# Patient Record
Sex: Female | Born: 1985 | Race: White | Hispanic: No | Marital: Married | Smoking: Former smoker
Health system: Southern US, Community
[De-identification: ages and names within clinical notes are randomized; demographics above are authoritative.]

---

## 2015-05-07 DIAGNOSIS — O24419 Gestational diabetes mellitus in pregnancy, unspecified control: Secondary | ICD-10-CM

## 2018-11-19 LAB — HIV ANTIBODY (ROUTINE TESTING W REFLEX): HIV Screen 4th Generation wRfx: NONREACTIVE

## 2018-11-19 LAB — OB RESULTS CONSOLE RPR: RPR: NONREACTIVE

## 2018-11-19 LAB — OB RESULTS CONSOLE ABO/RH: RH TYPE: POSITIVE

## 2018-11-19 LAB — OB RESULTS CONSOLE HIV ANTIBODY (ROUTINE TESTING): HIV: NONREACTIVE

## 2018-11-19 LAB — OB RESULTS CONSOLE VARICELLA ZOSTER ANTIBODY, IGG: Varicella: IMMUNE

## 2018-11-19 LAB — OB RESULTS CONSOLE HEPATITIS B SURFACE ANTIGEN: Hepatitis B Surface Ag: NEGATIVE

## 2018-11-19 LAB — OB RESULTS CONSOLE PLATELET COUNT: Platelets: 251

## 2018-11-19 LAB — OB RESULTS CONSOLE HGB/HCT, BLOOD
HEMATOCRIT: 37 (ref 29–41)
Hemoglobin: 12.1

## 2018-11-19 LAB — OB RESULTS CONSOLE ANTIBODY SCREEN: Antibody Screen: NEGATIVE

## 2018-12-07 LAB — OB RESULTS CONSOLE HEPATITIS B SURFACE ANTIGEN: HEP B S AG: NEGATIVE

## 2018-12-07 LAB — OB RESULTS CONSOLE HIV ANTIBODY (ROUTINE TESTING): HIV: NONREACTIVE

## 2018-12-07 LAB — HIV ANTIBODY (ROUTINE TESTING W REFLEX): HIV Screen 4th Generation wRfx: NONREACTIVE

## 2018-12-07 LAB — OB RESULTS CONSOLE VARICELLA ZOSTER ANTIBODY, IGG: Varicella: IMMUNE

## 2018-12-07 LAB — OB RESULTS CONSOLE ANTIBODY SCREEN: Antibody Screen: NEGATIVE

## 2019-01-02 LAB — OB RESULTS CONSOLE GC/CHLAMYDIA
CHLAMYDIA, DNA PROBE: NEGATIVE
Gonorrhea: NEGATIVE

## 2019-01-16 DIAGNOSIS — Z363 Encounter for antenatal screening for malformations: Secondary | ICD-10-CM | POA: Diagnosis not present

## 2019-02-20 DIAGNOSIS — Z3480 Encounter for supervision of other normal pregnancy, unspecified trimester: Secondary | ICD-10-CM | POA: Insufficient documentation

## 2019-02-21 ENCOUNTER — Other Ambulatory Visit: Payer: Self-pay

## 2019-02-21 ENCOUNTER — Encounter: Payer: Self-pay | Admitting: Obstetrics and Gynecology

## 2019-02-21 ENCOUNTER — Ambulatory Visit (INDEPENDENT_AMBULATORY_CARE_PROVIDER_SITE_OTHER): Payer: BLUE CROSS/BLUE SHIELD | Admitting: Obstetrics and Gynecology

## 2019-02-21 DIAGNOSIS — Z8632 Personal history of gestational diabetes: Secondary | ICD-10-CM

## 2019-02-21 DIAGNOSIS — Z3480 Encounter for supervision of other normal pregnancy, unspecified trimester: Secondary | ICD-10-CM

## 2019-02-21 DIAGNOSIS — O09299 Supervision of pregnancy with other poor reproductive or obstetric history, unspecified trimester: Secondary | ICD-10-CM | POA: Insufficient documentation

## 2019-02-21 DIAGNOSIS — Z3A25 25 weeks gestation of pregnancy: Secondary | ICD-10-CM

## 2019-02-21 DIAGNOSIS — O09292 Supervision of pregnancy with other poor reproductive or obstetric history, second trimester: Secondary | ICD-10-CM

## 2019-02-21 DIAGNOSIS — O34219 Maternal care for unspecified type scar from previous cesarean delivery: Secondary | ICD-10-CM

## 2019-02-21 DIAGNOSIS — Z3009 Encounter for other general counseling and advice on contraception: Secondary | ICD-10-CM

## 2019-02-21 NOTE — Progress Notes (Signed)
Pt is here for initial ROB, transfer from [facility]. [redacted]w[redacted]d.

## 2019-02-21 NOTE — Progress Notes (Signed)
  Subjective:    Janet Ortiz is a [id number] [redacted]w[redacted]d being seen today for on going prenatal visit. Patient transferred care from [place] with records.  Her obstetrical history is significant for history of GDM on insulin during first pregnancy, fetal macrosomia without GDM with second pregnancy, previous cesarean section x 2. Patient does intend to breast feed. Pregnancy history fully reviewed.  Patient reports no complaints.  Vitals:   02/21/19 1016 02/21/19 1017  BP: 108/69   Pulse: 84   Weight: 195 lb 9.6 oz (88.7 kg)   Height:  5\' 6"  (1.676 m)    HISTORY: OB History  Gravida Para Term Preterm AB Living  3 2 2     2   SAB TAB Ectopic Multiple Live Births               # Outcome Date GA Lbr Len/2nd Weight Sex Delivery Anes PTL Lv  3 Current           2 Term           1 Term            History reviewed. No pertinent past medical history. Past Surgical History:  Procedure Laterality Date  . CESAREAN SECTION     Family History  Problem Relation Age of Onset  . Diabetes Mother   . Diabetes Maternal Grandmother      Exam    Uterus:  Fundal Height: 25 cm25 cm      Assessment:    Pregnancy: [id number] Patient Active Problem List   Diagnosis Date Noted  . History of gestational diabetes in prior pregnancy, currently pregnant 02/21/2019  . Previous cesarean delivery affecting pregnancy 02/21/2019  . Supervision of other normal pregnancy, antepartum 02/20/2019        Plan:     Initial labs drawn. Prenatal vitamins. Problem list reviewed and updated. Genetic Screening discussed First Screen: results reviewed.  Ultrasound discussed; fetal survey: report requested.  Follow up in 3 weeks fasting for 2 hour glucola. Patient no longer desires fertility and plans BTL with repeat cesarean section 50% of 30 min visit spent on counseling and coordination of care.     Janet Ortiz 02/21/2019

## 2019-03-13 ENCOUNTER — Ambulatory Visit (INDEPENDENT_AMBULATORY_CARE_PROVIDER_SITE_OTHER): Payer: BLUE CROSS/BLUE SHIELD | Admitting: Obstetrics & Gynecology

## 2019-03-13 ENCOUNTER — Other Ambulatory Visit: Payer: BLUE CROSS/BLUE SHIELD

## 2019-03-13 ENCOUNTER — Other Ambulatory Visit: Payer: Self-pay

## 2019-03-13 DIAGNOSIS — Z3480 Encounter for supervision of other normal pregnancy, unspecified trimester: Secondary | ICD-10-CM

## 2019-03-13 DIAGNOSIS — Z3A27 27 weeks gestation of pregnancy: Secondary | ICD-10-CM

## 2019-03-13 DIAGNOSIS — Z3482 Encounter for supervision of other normal pregnancy, second trimester: Secondary | ICD-10-CM

## 2019-03-13 DIAGNOSIS — Z23 Encounter for immunization: Secondary | ICD-10-CM | POA: Diagnosis not present

## 2019-03-13 NOTE — Progress Notes (Signed)
   PRENATAL VISIT NOTE  Subjective:  Janet Ortiz is a 33 y.o. G3P2002 at [redacted]w[redacted]d being seen today for ongoing prenatal care.  She is currently monitored for the following issues for this high-risk pregnancy and has Supervision of other normal pregnancy, antepartum; History of gestational diabetes in prior pregnancy, currently pregnant; Previous cesarean delivery affecting pregnancy; and Unwanted fertility on their problem list.  Patient reports no complaints.  Contractions: Not present. Vag. Bleeding: None.  Movement: Present. Denies leaking of fluid.   The following portions of the patient's history were reviewed and updated as appropriate: allergies, current medications, past family history, past medical history, past social history, past surgical history and problem list.   Objective:   Vitals:   03/13/19 0821  BP: 99/64  Pulse: 80  Weight: 196 lb (88.9 kg)    Fetal Status: Fetal Heart Rate (bpm): 154   Movement: Present     General:  Alert, oriented and cooperative. Patient is in no acute distress.  Skin: Skin is warm and dry. No rash noted.   Cardiovascular: Normal heart rate noted  Respiratory: Normal respiratory effort, no problems with respiration noted  Abdomen: Soft, gravid, appropriate for gestational age.  Pain/Pressure: Absent     Pelvic: Cervical exam deferred        Extremities: Normal range of motion.  Edema: None  Mental Status: Normal mood and affect. Normal behavior. Normal judgment and thought content.   Assessment and Plan:  Pregnancy: G3P2002 at [redacted]w[redacted]d 1. Supervision of other normal pregnancy, antepartum Routine  - Glucose Tolerance, 2 Hours w/1 Hour - CBC - HIV antibody (with reflex) - RPR  Preterm labor symptoms and general obstetric precautions including but not limited to vaginal bleeding, contractions, leaking of fluid and fetal movement were reviewed in detail with the patient. Please refer to After Visit Summary for other counseling recommendations.   Return in about 2 weeks (around 03/27/2019) for tele.  No future appointments.  Scheryl Darter, MD

## 2019-03-13 NOTE — Progress Notes (Signed)
ROB/GTT.   TDAP given in LD, tolerated well. ?

## 2019-03-13 NOTE — Patient Instructions (Signed)

## 2019-03-14 LAB — HIV ANTIBODY (ROUTINE TESTING W REFLEX): HIV Screen 4th Generation wRfx: NONREACTIVE

## 2019-03-14 LAB — CBC
Hematocrit: 34.9 % (ref 34.0–46.6)
Hemoglobin: 11.5 g/dL (ref 11.1–15.9)
MCH: 30.7 pg (ref 26.6–33.0)
MCHC: 33 g/dL (ref 31.5–35.7)
MCV: 93 fL (ref 79–97)
Platelets: 167 10*3/uL (ref 150–450)
RBC: 3.74 x10E6/uL — ABNORMAL LOW (ref 3.77–5.28)
RDW: 12.6 % (ref 11.7–15.4)
WBC: 6.9 10*3/uL (ref 3.4–10.8)

## 2019-03-14 LAB — GLUCOSE TOLERANCE, 2 HOURS W/ 1HR
Glucose, 1 hour: 129 mg/dL (ref 65–179)
Glucose, 2 hour: 122 mg/dL (ref 65–152)
Glucose, Fasting: 83 mg/dL (ref 65–91)

## 2019-03-14 LAB — RPR: RPR Ser Ql: NONREACTIVE

## 2019-03-27 ENCOUNTER — Other Ambulatory Visit: Payer: Self-pay

## 2019-03-27 ENCOUNTER — Ambulatory Visit (INDEPENDENT_AMBULATORY_CARE_PROVIDER_SITE_OTHER): Payer: BLUE CROSS/BLUE SHIELD | Admitting: Obstetrics and Gynecology

## 2019-03-27 ENCOUNTER — Encounter: Payer: Self-pay | Admitting: Obstetrics and Gynecology

## 2019-03-27 DIAGNOSIS — O09299 Supervision of pregnancy with other poor reproductive or obstetric history, unspecified trimester: Secondary | ICD-10-CM

## 2019-03-27 DIAGNOSIS — Z3009 Encounter for other general counseling and advice on contraception: Secondary | ICD-10-CM

## 2019-03-27 DIAGNOSIS — O34219 Maternal care for unspecified type scar from previous cesarean delivery: Secondary | ICD-10-CM

## 2019-03-27 DIAGNOSIS — Z3480 Encounter for supervision of other normal pregnancy, unspecified trimester: Secondary | ICD-10-CM

## 2019-03-27 DIAGNOSIS — Z8632 Personal history of gestational diabetes: Secondary | ICD-10-CM

## 2019-03-27 DIAGNOSIS — Z3A29 29 weeks gestation of pregnancy: Secondary | ICD-10-CM

## 2019-03-27 DIAGNOSIS — O09293 Supervision of pregnancy with other poor reproductive or obstetric history, third trimester: Secondary | ICD-10-CM

## 2019-03-27 NOTE — Progress Notes (Signed)
   TELEHEALTH VIRTUAL OBSTETRICS VISIT ENCOUNTER NOTE  I connected with Janet Ortiz on 03/27/19 at  9:00 AM EDT by telephone at home and verified that I am speaking with the correct person using two identifiers.   I discussed the limitations, risks, security and privacy concerns of performing an evaluation and management service by telephone and the availability of in person appointments. I also discussed with the patient that there may be a patient responsible charge related to this service. The patient expressed understanding and agreed to proceed.  Subjective:  Janet Ortiz is a 33 y.o. G3P2002 at [redacted]w[redacted]d being followed for ongoing prenatal care.  She is currently monitored for the following issues for this high-risk pregnancy and has Supervision of other normal pregnancy, antepartum; History of gestational diabetes in prior pregnancy, currently pregnant; Previous cesarean delivery affecting pregnancy; and Unwanted fertility on their problem list.  Patient reports no complaints. Reports fetal movement. Denies any contractions, bleeding or leaking of fluid.   The following portions of the patient's history were reviewed and updated as appropriate: allergies, current medications, past family history, past medical history, past social history, past surgical history and problem list.   Objective:   General:  Alert, oriented and cooperative.   Mental Status: Normal mood and affect perceived. Normal judgment and thought content.  Rest of physical exam deferred due to type of encounter  Assessment and Plan:  Pregnancy: G3P2002 at [redacted]w[redacted]d 1. Supervision of other normal pregnancy, antepartum Stable - Babyscripts Schedule Optimization  2. Previous cesarean delivery affecting pregnancy For repeat at 39 weeks  3. History of gestational diabetes in prior pregnancy, currently pregnant Normal Glucola Will check growth @ 36-37 weeks  4. Unwanted fertility Has BCBS  Preterm labor symptoms and general  obstetric precautions including but not limited to vaginal bleeding, contractions, leaking of fluid and fetal movement were reviewed in detail with the patient.  I discussed the assessment and treatment plan with the patient. The patient was provided an opportunity to ask questions and all were answered. The patient agreed with the plan and demonstrated an understanding of the instructions. The patient was advised to call back or seek an in-person office evaluation/go to MAU at [facility] for any urgent or concerning symptoms. Please refer to After Visit Summary for other counseling recommendations.   I provided 11 minutes of non-face-to-face time during this encounter.  Return in about 2 weeks (around 04/10/2019) for OB visit, televisit.  No future appointments.  Hermina Staggers, MD Center for [facility], [facility]

## 2019-03-27 NOTE — Progress Notes (Signed)
Pt is on the phone preparing for webex visit with provider. [redacted]w[redacted]d.

## 2019-04-10 ENCOUNTER — Ambulatory Visit (INDEPENDENT_AMBULATORY_CARE_PROVIDER_SITE_OTHER): Payer: BLUE CROSS/BLUE SHIELD | Admitting: Obstetrics

## 2019-04-10 ENCOUNTER — Other Ambulatory Visit: Payer: Self-pay

## 2019-04-10 ENCOUNTER — Encounter: Payer: Self-pay | Admitting: Obstetrics

## 2019-04-10 DIAGNOSIS — Z3A31 31 weeks gestation of pregnancy: Secondary | ICD-10-CM

## 2019-04-10 DIAGNOSIS — O34219 Maternal care for unspecified type scar from previous cesarean delivery: Secondary | ICD-10-CM

## 2019-04-10 DIAGNOSIS — M5440 Lumbago with sciatica, unspecified side: Secondary | ICD-10-CM

## 2019-04-10 DIAGNOSIS — O26893 Other specified pregnancy related conditions, third trimester: Secondary | ICD-10-CM

## 2019-04-10 MED ORDER — COMFORT FIT MATERNITY SUPP SM MISC: 0 refills | Status: DC

## 2019-04-10 NOTE — Progress Notes (Signed)
   TELEHEALTH VIRTUAL OBSTETRICS PRENATAL VISIT ENCOUNTER NOTE  I connected with Janet Ortiz on 04/10/19 at  9:15 AM EDT by WebEx at home and verified that I am speaking with the correct person using two identifiers.   I discussed the limitations, risks, security and privacy concerns of performing an evaluation and management service by telephone and the availability of in person appointments. I also discussed with the patient that there may be a patient responsible charge related to this service. The patient expressed understanding and agreed to proceed. Subjective:  Janet Ortiz is a 33 y.o. G3P2002 at [redacted]w[redacted]d being seen today for ongoing prenatal care.  She is currently monitored for the following issues for this low-risk pregnancy and has Supervision of other normal pregnancy, antepartum; History of gestational diabetes in prior pregnancy, currently pregnant; Previous cesarean delivery affecting pregnancy; and Unwanted fertility on their problem list.  Patient reports backache.  Reports fetal movement. Contractions: Not present. Vag. Bleeding: None.  Movement: Present. Denies any contractions, bleeding or leaking of fluid.   The following portions of the patient's history were reviewed and updated as appropriate: allergies, current medications, past family history, past medical history, past social history, past surgical history and problem list.   Objective:  There were no vitals filed for this visit.  Fetal Status:     Movement: Present     General:  Alert, oriented and cooperative. Patient is in no acute distress.  Respiratory: Normal respiratory effort, no problems with respiration noted  Mental Status: Normal mood and affect. Normal behavior. Normal judgment and thought content.  Rest of physical exam deferred due to type of encounter  Assessment and Plan:  Pregnancy: G3P2002 at [redacted]w[redacted]d 1. Bilateral low back pain with sciatica, sciatica laterality unspecified, unspecified chronicity Rx:  - Elastic Bandages & Supports (COMFORT FIT MATERNITY SUPP SM) MISC; WEAR AS DIRECTED.  Dispense: 1 each; Refill: 0  2. Previous cesarean delivery affecting pregnancy   Preterm labor symptoms and general obstetric precautions including but not limited to vaginal bleeding, contractions, leaking of fluid and fetal movement were reviewed in detail with the patient. I discussed the assessment and treatment plan with the patient. The patient was provided an opportunity to ask questions and all were answered. The patient agreed with the plan and demonstrated an understanding of the instructions. The patient was advised to call back or seek an in-person office evaluation/go to MAU at [facility] for any urgent or concerning symptoms. Please refer to After Visit Summary for other counseling recommendations.   I provided 10 minutes of face-to-face via WebEx time during this encounter.  Return in about 2 weeks (around 04/24/2019) for [facility].   Coral Ceo, MD Center for [facility], [facility] 04-10-2019

## 2019-04-18 ENCOUNTER — Telehealth: Payer: Self-pay | Admitting: *Deleted

## 2019-04-18 NOTE — Telephone Encounter (Signed)
Pt states she has been working at home due to job site closer due to [employer]. Pt states her job is reopening on June 1st for employees to come back in to work. Pt would like to know if we can approve for her to continue to work at home for the 3 weeks until she is scheduled to deliver. Pt ask for letter to be typed stating she may continue to work from home and will return after her postpartum period.     Please advise...may pt continue to work from home until delivery.

## 2019-04-19 NOTE — Telephone Encounter (Signed)
Patient may continue to work at home until after delivery.

## 2019-04-22 ENCOUNTER — Encounter: Payer: Self-pay | Admitting: *Deleted

## 2019-04-24 ENCOUNTER — Other Ambulatory Visit: Payer: Self-pay

## 2019-04-24 ENCOUNTER — Ambulatory Visit (INDEPENDENT_AMBULATORY_CARE_PROVIDER_SITE_OTHER): Payer: BLUE CROSS/BLUE SHIELD

## 2019-04-24 VITALS — Wt 192.0 lb

## 2019-04-24 DIAGNOSIS — Z3483 Encounter for supervision of other normal pregnancy, third trimester: Secondary | ICD-10-CM

## 2019-04-24 DIAGNOSIS — Z3A33 33 weeks gestation of pregnancy: Secondary | ICD-10-CM

## 2019-04-24 DIAGNOSIS — O34219 Maternal care for unspecified type scar from previous cesarean delivery: Secondary | ICD-10-CM

## 2019-04-24 DIAGNOSIS — Z3480 Encounter for supervision of other normal pregnancy, unspecified trimester: Secondary | ICD-10-CM

## 2019-04-24 NOTE — Progress Notes (Signed)
Pt is on the phone preparing for virtual visit with provider. [redacted]w[redacted]d. Pt has a BP cuff at home but cannot find it this morning, pt denies any headaches or blurry vision. I advised patient that if she can find cuff and check in later today, and to let us know if it is abnormal- pt verbalizes understanding.

## 2019-04-24 NOTE — Progress Notes (Signed)
   TELEHEALTH VIRTUAL OBSTETRICS VISIT ENCOUNTER NOTE  I connected with Janet Ortiz on 04/24/19 at  9:00 AM EDT by WebEx at home and verified that I am speaking with the correct person using two identifiers.   I discussed the limitations, risks, security and privacy concerns of performing an evaluation and management service by telephone and the availability of in person appointments. I also discussed with the patient that there may be a patient responsible charge related to this service. The patient expressed understanding and agreed to proceed.  Subjective:  Janet Ortiz is a 32 y.o. G3P2002 at [redacted]w[redacted]d being followed for ongoing prenatal care.  She is currently monitored for the following issues for this low-risk pregnancy and has Supervision of other normal pregnancy, antepartum; History of gestational diabetes in prior pregnancy, currently pregnant; Previous cesarean delivery affecting pregnancy; and Unwanted fertility on their problem list.  Patient reports no complaints. Reports fetal movement. Denies any contractions, bleeding or leaking of fluid.   The following portions of the patient's history were reviewed and updated as appropriate: allergies, current medications, past family history, past medical history, past social history, past surgical history and problem list.   Objective:   General:  Alert, oriented and cooperative.   Mental Status: Normal mood and affect perceived. Normal judgment and thought content.  Rest of physical exam deferred due to type of encounter  Assessment and Plan:  Pregnancy: G3P2002 at [redacted]w[redacted]d 1. Supervision of other normal pregnancy, antepartum -BP 115/57 -Per note from Dr. Alysia Penna, will schedule for growth u/s at 36 weeks due to hx of macrosomia - Encouraged patient to call ahead if experiencing respiratory symptoms with fever so that she can be triaged and directed appropriately if testing is warranted -Counseled patient on routine COVID-19 testing for all  admission to inpatient units whether direct admit or from MAU/ED. Reviewed reasons for testing including identifying cases and protecting patients/staff from possible infection. Reassured patient that separation from newborn is not required for COVID+ tests in asymptomatic patients and that the plan of care will be created with Peds through shared decision-making. One support person is allowed for all admitted patients regardless of COVID test results. All patient questions answered.    2. Previous cesarean delivery affecting pregnancy -C/S scheduled for 6/26  Preterm labor symptoms and general obstetric precautions including but not limited to vaginal bleeding, contractions, leaking of fluid and fetal movement were reviewed in detail with the patient.  I discussed the assessment and treatment plan with the patient. The patient was provided an opportunity to ask questions and all were answered. The patient agreed with the plan and demonstrated an understanding of the instructions. The patient was advised to call back or seek an in-person office evaluation/go to MAU at [facility] for any urgent or concerning symptoms. Please refer to After Visit Summary for other counseling recommendations.   I provided 15 minutes of non-face-to-face time during this encounter.  Return in about 3 weeks (around 05/15/2019) for Return OB visit/GBS.  Future Appointments  Date Time Provider Department Center  04/24/2019  9:00 AM Rolm Bookbinder, CNM [facility] None    Rolm Bookbinder, CNM Center for [employer], [facility]

## 2019-05-01 DIAGNOSIS — M5440 Lumbago with sciatica, unspecified side: Secondary | ICD-10-CM | POA: Diagnosis not present

## 2019-05-15 ENCOUNTER — Ambulatory Visit (HOSPITAL_COMMUNITY)
Admission: RE | Admit: 2019-05-15 | Discharge: 2019-05-15 | Disposition: A | Discharge status: Home / Self Care | Payer: BC Managed Care – PPO | Source: Ambulatory Visit | Attending: Obstetrics and Gynecology | Admitting: Obstetrics and Gynecology

## 2019-05-15 ENCOUNTER — Other Ambulatory Visit: Payer: Self-pay

## 2019-05-15 ENCOUNTER — Ambulatory Visit (INDEPENDENT_AMBULATORY_CARE_PROVIDER_SITE_OTHER): Payer: BC Managed Care – PPO | Admitting: Obstetrics and Gynecology

## 2019-05-15 ENCOUNTER — Encounter: Payer: Self-pay | Admitting: Obstetrics and Gynecology

## 2019-05-15 VITALS — BP 113/68 | HR 76 | Wt 207.0 lb

## 2019-05-15 DIAGNOSIS — O09299 Supervision of pregnancy with other poor reproductive or obstetric history, unspecified trimester: Secondary | ICD-10-CM

## 2019-05-15 DIAGNOSIS — Z363 Encounter for antenatal screening for malformations: Secondary | ICD-10-CM | POA: Diagnosis not present

## 2019-05-15 DIAGNOSIS — Z3A36 36 weeks gestation of pregnancy: Secondary | ICD-10-CM | POA: Diagnosis not present

## 2019-05-15 DIAGNOSIS — O34219 Maternal care for unspecified type scar from previous cesarean delivery: Secondary | ICD-10-CM

## 2019-05-15 DIAGNOSIS — O09293 Supervision of pregnancy with other poor reproductive or obstetric history, third trimester: Secondary | ICD-10-CM | POA: Diagnosis not present

## 2019-05-15 DIAGNOSIS — Z8632 Personal history of gestational diabetes: Secondary | ICD-10-CM

## 2019-05-15 DIAGNOSIS — Z3480 Encounter for supervision of other normal pregnancy, unspecified trimester: Secondary | ICD-10-CM

## 2019-05-15 DIAGNOSIS — Z113 Encounter for screening for infections with a predominantly sexual mode of transmission: Secondary | ICD-10-CM | POA: Diagnosis not present

## 2019-05-15 DIAGNOSIS — Z3009 Encounter for other general counseling and advice on contraception: Secondary | ICD-10-CM

## 2019-05-15 NOTE — Progress Notes (Signed)
   PRENATAL VISIT NOTE  Subjective:  Janet Ortiz is a 33 y.o. G3P2002 at [redacted]w[redacted]d being seen today for ongoing prenatal care.  She is currently monitored for the following issues for this low-risk pregnancy and has Supervision of other normal pregnancy, antepartum; History of gestational diabetes in prior pregnancy, currently pregnant; Previous cesarean delivery affecting pregnancy; and Unwanted fertility on their problem list.  Patient reports no complaints.  Contractions: Not present. Vag. Bleeding: None.  Movement: Present. Denies leaking of fluid.   The following portions of the patient's history were reviewed and updated as appropriate: allergies, current medications, past family history, past medical history, past social history, past surgical history and problem list.   Objective:   Vitals:   05/15/19 1606  BP: 113/68  Pulse: 76  Weight: 207 lb (93.9 kg)    Fetal Status: Fetal Heart Rate (bpm): 140   Movement: Present     General:  Alert, oriented and cooperative. Patient is in no acute distress.  Skin: Skin is warm and dry. No rash noted.   Cardiovascular: Normal heart rate noted  Respiratory: Normal respiratory effort, no problems with respiration noted  Abdomen: Soft, gravid, appropriate for gestational age.  Pain/Pressure: Present     Pelvic: Cervical exam deferred        Extremities: Normal range of motion.     Mental Status: Normal mood and affect. Normal behavior. Normal judgment and thought content.   Assessment and Plan:  Pregnancy: G3P2002 at 105w6d  1. Supervision of other normal pregnancy, antepartum GBS, GC/CT today  2. History of gestational diabetes in prior pregnancy, currently pregnant  3. Previous cesarean delivery affecting pregnancy For RCS  4. Unwanted fertility For BTL  Preterm labor symptoms and general obstetric precautions including but not limited to vaginal bleeding, contractions, leaking of fluid and fetal movement were reviewed in detail with  the patient. Please refer to After Visit Summary for other counseling recommendations.   Return in about 1 week (around 05/22/2019) for OB visit, virtual.  Future Appointments  Date Time Provider [place]  05/22/2019  9:00 AM Shelly Bombard, MD [place] None  05/28/2019  7:40 AM [facility] 1 [facility] None    Sloan Leiter, MD

## 2019-05-16 LAB — CERVICOVAGINAL ANCILLARY ONLY
Chlamydia: NEGATIVE
Neisseria Gonorrhea: NEGATIVE

## 2019-05-17 LAB — STREP GP B NAA: Strep Gp B NAA: NEGATIVE

## 2019-05-19 ENCOUNTER — Telehealth (HOSPITAL_COMMUNITY): Payer: Self-pay | Admitting: *Deleted

## 2019-05-19 NOTE — Telephone Encounter (Signed)
Preadmission screen  

## 2019-05-20 ENCOUNTER — Encounter (HOSPITAL_COMMUNITY): Payer: Self-pay

## 2019-05-20 NOTE — Patient Instructions (Signed)
Janet Ortiz  05/20/2019   Your procedure is scheduled on:  05/30/2019  Arrive at [facility] at Entrance C on [address] at [facility]  and [address]. You are invited to use the FREE valet parking or use the Visitor's parking deck.  Pick up the phone at the desk and dial [phone].  Call this number if you have problems the morning of surgery: [phone]  Remember:   Do not eat food:(After Midnight) Desps de medianoche.  Do not drink clear liquids: (After Midnight) Desps de medianoche.  Take these medicines the morning of surgery with A SIP OF WATER:  none   Do not wear jewelry, make-up or nail polish.  Do not wear lotions, powders, or perfumes. Do not wear deodorant.  Do not shave 48 hours prior to surgery.  Do not bring valuables to the hospital.  [facility] is not   responsible for any belongings or valuables brought to the hospital.  Contacts, dentures or bridgework may not be worn into surgery.  Leave suitcase in the car. After surgery it may be brought to your room.  For patients admitted to the hospital, checkout time is 11:00 AM the day of              discharge.      Please read over the following fact sheets that you were given:     Preparing for Surgery

## 2019-05-22 ENCOUNTER — Telehealth (INDEPENDENT_AMBULATORY_CARE_PROVIDER_SITE_OTHER): Payer: BC Managed Care – PPO | Admitting: Obstetrics

## 2019-05-22 ENCOUNTER — Encounter: Payer: Self-pay | Admitting: Obstetrics

## 2019-05-22 ENCOUNTER — Other Ambulatory Visit: Payer: Self-pay

## 2019-05-22 VITALS — BP 115/76 | HR 85 | Ht 66.0 in

## 2019-05-22 DIAGNOSIS — Z302 Encounter for sterilization: Secondary | ICD-10-CM

## 2019-05-22 DIAGNOSIS — Z8632 Personal history of gestational diabetes: Secondary | ICD-10-CM

## 2019-05-22 DIAGNOSIS — O09293 Supervision of pregnancy with other poor reproductive or obstetric history, third trimester: Secondary | ICD-10-CM

## 2019-05-22 DIAGNOSIS — Z3480 Encounter for supervision of other normal pregnancy, unspecified trimester: Secondary | ICD-10-CM

## 2019-05-22 DIAGNOSIS — O09299 Supervision of pregnancy with other poor reproductive or obstetric history, unspecified trimester: Secondary | ICD-10-CM

## 2019-05-22 DIAGNOSIS — O34219 Maternal care for unspecified type scar from previous cesarean delivery: Secondary | ICD-10-CM

## 2019-05-22 DIAGNOSIS — Z3A37 37 weeks gestation of pregnancy: Secondary | ICD-10-CM

## 2019-05-22 NOTE — Progress Notes (Signed)
TELEHEALTH OBSTETRICS PRENATAL VIRTUAL VIDEO VISIT ENCOUNTER NOTE  Provider location: [facility]   I connected with Janet CampElise Ortiz on 05/22/19 at  9:00 AM EDT by WebEx OB MyChart Video Encounter at home and verified that I am speaking with the correct person using two identifiers.   I discussed the limitations, risks, security and privacy concerns of performing an evaluation and management service by telephone and the availability of in person appointments. I also discussed with the patient that there may be a patient responsible charge related to this service. The patient expressed understanding and agreed to proceed. Subjective:  Janet Ortiz is a 33 y.o. G3P2002 at 101w6d being seen today for ongoing prenatal care.  She is currently monitored for the following issues for this low-risk pregnancy and has Supervision of other normal pregnancy, antepartum; History of gestational diabetes in prior pregnancy, currently pregnant; Previous cesarean delivery affecting pregnancy; and Unwanted fertility on their problem list.  Patient reports no complaints.   .  .   . Denies any leaking of fluid.   The following portions of the patient's history were reviewed and updated as appropriate: allergies, current medications, past family history, past medical history, past social history, past surgical history and problem list.   Objective:   Vitals:   05/22/19 0915  BP: 115/76  Pulse: 85  Height: 5\' 6"  (1.676 m)    Fetal Status:           General:  Alert, oriented and cooperative. Patient is in no acute distress.  Respiratory: Normal respiratory effort, no problems with respiration noted  Mental Status: Normal mood and affect. Normal behavior. Normal judgment and thought content.  Rest of physical exam deferred due to type of encounter  Imaging: [place] Mfm Ob Comp + 14 Wk  Result Date: 05/16/2019 ----------------------------------------------------------------------   OBSTETRICS REPORT                        (Signed Final 05/16/2019 07:24 am) ---------------------------------------------------------------------- Patient Info  ID #:       [id number]                          D.O.B.:  29-Jun-1986 (32 yrs)  Name:       Janet Ortiz                      Visit Date: 05/15/2019 02:59 pm ---------------------------------------------------------------------- Performed By  Performed By:     Tomma Lightningevin Vics             Ref. Address:      Faculty                    RDMS,RVT  Attending:        Lin Landsmanorenthian Booker      [facility]                                        [facility]  Referred By:      Rolm BookbinderAROLINE M                    NEILL CNM ----------------------------------------------------------------------  Orders   #  Description                          Code         Ordered By   1  US MFM OB COMP + 14 WK               [id number]     CAROLINE NEILL  ----------------------------------------------------------------------   #  Order #                    Accession #                 Episode #   1  [id number]                  [phone]                  [id number]  ---------------------------------------------------------------------- Indications   Encounter for antenatal screening for          Z36.3   malformations   [redacted] weeks gestation of pregnancy                Z3A.36   Poor obstetric history: Prior fetal            O09.299   macrosomia, antepartum   History of cesarean delivery, currently        O34.219   pregnant (x2)   Poor obstetric history: Previous gestational   O09.299   diabetes  ---------------------------------------------------------------------- Vital Signs  Weight (lb): 192                               Height:        5'6"  BMI:         30.99 ---------------------------------------------------------------------- Fetal Evaluation  Num Of Fetuses:          1  Fetal Heart Rate(bpm):   149  Cardiac Activity:        Observed  Presentation:             Cephalic  Placenta:                Anterior  P. Cord Insertion:       Visualized  Amniotic Fluid  AFI FV:      Within normal limits  AFI Sum(cm)     %Tile       Largest Pocket(cm)  15.38           58          5.64  RUQ(cm)       RLQ(cm)       LUQ(cm)        LLQ(cm)  5.64          4.16          1.24           4.34 ---------------------------------------------------------------------- Biometry  BPD:      89.5  mm     G. Age:  36w 2d         46  %    CI:        77.47   %    70 - 86  FL/HC:       22.0  %    20.8 - 22.6  HC:      321.9  mm     G. Age:  36w 3d         15  %    HC/AC:       0.87       0.92 - 1.05  AC:       370   mm     G. Age:  40w 6d       > 97  %    FL/BPD:      79.0  %    71 - 87  FL:       70.7  mm     G. Age:  36w 2d         33  %    FL/AC:       19.1  %    20 - 24  HUM:      62.7  mm     G. Age:  36w 3d         64  %  LV:        4.3  mm  Est. FW:    3605   gm   7 lb 15 oz    > 90  % ---------------------------------------------------------------------- OB History  Gravidity:    3         Term:   2  Living:       2 ---------------------------------------------------------------------- Gestational Age  LMP:           36w 6d        Date:  08/30/18                 EDD:   06/06/19  U/S Today:     37w 3d                                        EDD:   06/02/19  Best:          36w 6d     Det. By:  LMP  (08/30/18)          EDD:   06/06/19 ---------------------------------------------------------------------- Anatomy  Cranium:               Appears normal         LVOT:                   Not well visualized  Cavum:                 Appears normal         Aortic Arch:            Not well visualized  Ventricles:            Appears normal         Ductal Arch:            Not well visualized  Choroid Plexus:        Appears normal         Diaphragm:              Appears normal  Cerebellum:            Appears normal         Stomach:  Appears normal,  left                                                                        sided  Posterior Fossa:       Appears normal         Abdomen:                Appears normal  Nuchal Fold:           Not applicable (>20    Abdominal Wall:         Not well visualized                         wks GA)  Face:                  Orbits appear          Cord Vessels:           Not well visualized                         normal; profile nwv  Lips:                  Appears normal         Kidneys:                Appear normal  Palate:                Not well visualized    Bladder:                Appears normal  Thoracic:              Appears normal         Spine:                  Not well visualized  Heart:                 Appears normal         Upper Extremities:      RUE vis; LUE nwv                         (4CH, axis, and                         situs)  RVOT:                  Not well visualized    Lower Extremities:      RLE vis; LLE nwv  Other:  Nasal bone visualized. Technically difficult due to advanced GA and          fetal position. ---------------------------------------------------------------------- Cervix Uterus Adnexa  Cervix  Not visualized (advanced GA >24wks) ---------------------------------------------------------------------- Impression  Normal interval growth.  No ultrasonic evidence of structural  fetal anomalies.  Suboptimal views of the fetal anatomy was obtained  secondary to fetal position and late gestation.  Prior history of GDM ---------------------------------------------------------------------- Recommendations  Repeat anatomy in 4 weeks if undelivered. ----------------------------------------------------------------------  Lin Landsmanorenthian Booker, MD Electronically Signed Final Report   05/16/2019 07:24 am ----------------------------------------------------------------------   Assessment and Plan:  Pregnancy: G3P2002 at 7067w6d  1. Supervision of other normal pregnancy, antepartum  2.  History of gestational diabetes in prior pregnancy, currently pregnant  3. Previous cesarean delivery affecting pregnancy - desires repeat C/S  4. Request for sterilization   There are no diagnoses linked to this encounter. Term labor symptoms and general obstetric precautions including but not limited to vaginal bleeding, contractions, leaking of fluid and fetal movement were reviewed in detail with the patient. I discussed the assessment and treatment plan with the patient. The patient was provided an opportunity to ask questions and all were answered. The patient agreed with the plan and demonstrated an understanding of the instructions. The patient was advised to call back or seek an in-person office evaluation/[facility] for any urgent or concerning symptoms. Please refer to After Visit Summary for other counseling recommendations.   I provided 10 minutes of face-to-face time during this encounter.  Return in about 1 week (around 05/29/2019) for MyChart.  Future Appointments  Date Time Provider Department Center  05/28/2019  7:40 AM [facility] 1 [facility] None    Coral Ceoharles Mirtha Jain, MD [facility] 05-22-2019

## 2019-05-22 NOTE — Progress Notes (Signed)
MyChart OB.  C-Section scheduled for 39 weeks.

## 2019-05-28 ENCOUNTER — Other Ambulatory Visit (HOSPITAL_COMMUNITY)
Admission: RE | Admit: 2019-05-28 | Discharge: 2019-05-28 | Disposition: A | Discharge status: Home / Self Care | Payer: BC Managed Care – PPO | Source: Ambulatory Visit | Attending: Obstetrics and Gynecology | Admitting: Obstetrics and Gynecology

## 2019-05-28 ENCOUNTER — Other Ambulatory Visit: Payer: Self-pay

## 2019-05-28 DIAGNOSIS — Z1159 Encounter for screening for other viral diseases: Secondary | ICD-10-CM | POA: Insufficient documentation

## 2019-05-28 LAB — SARS CORONAVIRUS 2 (TAT 6-24 HRS): SARS Coronavirus 2: NEGATIVE

## 2019-05-28 NOTE — MAU Note (Signed)
Covid swab collected. Pt tolerated well. Pt asymptomatic 

## 2019-05-29 ENCOUNTER — Telehealth (INDEPENDENT_AMBULATORY_CARE_PROVIDER_SITE_OTHER): Payer: BC Managed Care – PPO | Admitting: Obstetrics

## 2019-05-29 ENCOUNTER — Encounter: Payer: Self-pay | Admitting: Obstetrics

## 2019-05-29 DIAGNOSIS — Z3009 Encounter for other general counseling and advice on contraception: Secondary | ICD-10-CM

## 2019-05-29 DIAGNOSIS — O09293 Supervision of pregnancy with other poor reproductive or obstetric history, third trimester: Secondary | ICD-10-CM

## 2019-05-29 DIAGNOSIS — Z3480 Encounter for supervision of other normal pregnancy, unspecified trimester: Secondary | ICD-10-CM

## 2019-05-29 DIAGNOSIS — O09299 Supervision of pregnancy with other poor reproductive or obstetric history, unspecified trimester: Secondary | ICD-10-CM

## 2019-05-29 DIAGNOSIS — Z8632 Personal history of gestational diabetes: Secondary | ICD-10-CM

## 2019-05-29 DIAGNOSIS — O34219 Maternal care for unspecified type scar from previous cesarean delivery: Secondary | ICD-10-CM

## 2019-05-29 DIAGNOSIS — Z3A38 38 weeks gestation of pregnancy: Secondary | ICD-10-CM

## 2019-05-29 NOTE — Progress Notes (Signed)
S/w pt to prepare for mychart visit. Pt reports fetal movement, denies pain. Pt is scheduled for BTL and c section tomorrow.,

## 2019-05-29 NOTE — Progress Notes (Signed)
TELEHEALTH OBSTETRICS PRENATAL VIRTUAL VIDEO VISIT ENCOUNTER NOTE  Provider [facility]: [facility]   I connected with Janet Ortiz on 05/29/19 at  9:30 AM EDT by WebEx OB MyChart Video Encounter at home and verified that I am speaking with the correct person using two identifiers.   I discussed the limitations, risks, security and privacy concerns of performing an evaluation and management service by telephone and the availability of in person appointments. I also discussed with the patient that there may be a patient responsible charge related to this service. The patient expressed understanding and agreed to proceed. Subjective:  Janet Ortiz is a 33 y.o. G3P2002 at 2184w6d being seen today for ongoing prenatal care.  She is currently monitored for the following issues for this low-risk pregnancy and has Supervision of other normal pregnancy, antepartum; History of gestational diabetes in prior pregnancy, currently pregnant; Previous cesarean delivery affecting pregnancy; and Unwanted fertility on their problem list.  Patient reports no complaints.  Contractions: Not present. Vag. Bleeding: None.  Movement: Present. Denies any leaking of fluid.   The following portions of the patient's history were reviewed and updated as appropriate: allergies, current medications, past family history, past medical history, past social history, past surgical history and problem list.   Objective:   Vitals:   05/29/19 0945  BP: 110/70  Pulse: 91    Fetal Status:     Movement: Present     General:  Alert, oriented and cooperative. Patient is in no acute distress.  Respiratory: Normal respiratory effort, no problems with respiration noted  Mental Status: Normal mood and affect. Normal behavior. Normal judgment and thought content.  Rest of physical exam deferred due to type of encounter  Imaging: [place] Mfm Ob Comp + 14 Wk  Result Date: 05/16/2019  ----------------------------------------------------------------------  OBSTETRICS REPORT                        (Signed Final 05/16/2019 07:24 am) ---------------------------------------------------------------------- Patient Info  ID #:       [id number]                          D.O.B.:  [birth year] (32 yrs)  Name:       Janet Ortiz                      Visit Date: 05/15/2019 02:59 pm ---------------------------------------------------------------------- Performed By  Performed By:     Janet Ortiz             Ref. Address:      Faculty                    RDMS,RVT  Attending:        Lin Landsmanorenthian Booker      [facility]                    MD                                        [facility]etal [facility]  Referred By:      Rolm BookbinderAROLINE M                    NEILL CNM ---------------------------------------------------------------------- Orders   #  Description                          Code         Ordered By   1  US MFM OB COMP + 14 WK               [id number]     CAROLINE NEILL  ----------------------------------------------------------------------   #  Order #                    Accession #                 Episode #   1  [id number]                  [phone]                  [id number]  ---------------------------------------------------------------------- Indications   Encounter for antenatal screening for          Z36.3   malformations   [redacted] weeks gestation of pregnancy                Z3A.36   Poor obstetric history: Prior fetal            O09.299   macrosomia, antepartum   History of cesarean delivery, currently        O34.219   pregnant (x2)   Poor obstetric history: Previous gestational   O09.299   diabetes  ---------------------------------------------------------------------- Vital Signs  Weight (lb): 192                               Height:        5'6"  BMI:         30.99 ---------------------------------------------------------------------- Fetal Evaluation  Num Of Fetuses:          1  Fetal Heart Rate(bpm):    149  Cardiac Activity:        Observed  Presentation:            Cephalic  Placenta:                Anterior  P. Cord Insertion:       Visualized  Amniotic Fluid  AFI FV:      Within normal limits  AFI Sum(cm)     %Tile       Largest Pocket(cm)  15.38           58          5.64  RUQ(cm)       RLQ(cm)       LUQ(cm)        LLQ(cm)  5.64          4.16          1.24           4.34 ---------------------------------------------------------------------- Biometry  BPD:      89.5  mm     G. Age:  36w 2d         46  %    CI:        77.47   %    70 - 86  FL/HC:       22.0  %    20.8 - 22.6  HC:      321.9  mm     G. Age:  36w 3d         15  %    HC/AC:       0.87       0.92 - 1.05  AC:       370   mm     G. Age:  40w 6d       > 97  %    FL/BPD:      79.0  %    71 - 87  FL:       70.7  mm     G. Age:  36w 2d         33  %    FL/AC:       19.1  %    20 - 24  HUM:      62.7  mm     G. Age:  36w 3d         59  %  LV:        4.3  mm  Est. FW:    3605   gm   7 lb 15 oz    > 90  % ---------------------------------------------------------------------- OB History  Gravidity:    3         Term:   2  Living:       2 ---------------------------------------------------------------------- Gestational Age  LMP:           36w 6d        Date:  08/30/18                 EDD:   06/06/19  U/S Today:     37w 3d                                        EDD:   06/02/19  Best:          36w 6d     Det. By:  LMP  (08/30/18)          EDD:   06/06/19 ---------------------------------------------------------------------- Anatomy  Cranium:               Appears normal         LVOT:                   Not well visualized  Cavum:                 Appears normal         Aortic Arch:            Not well visualized  Ventricles:            Appears normal         Ductal Arch:            Not well visualized  Choroid Plexus:        Appears normal         Diaphragm:              Appears normal  Cerebellum:             Appears normal         Stomach:  Appears normal, left                                                                        sided  Posterior Fossa:       Appears normal         Abdomen:                Appears normal  Nuchal Fold:           Not applicable (>89    Abdominal Wall:         Not well visualized                         wks GA)  Face:                  Orbits appear          Cord Vessels:           Not well visualized                         normal; profile nwv  Lips:                  Appears normal         Kidneys:                Appear normal  Palate:                Not well visualized    Bladder:                Appears normal  Thoracic:              Appears normal         Spine:                  Not well visualized  Heart:                 Appears normal         Upper Extremities:      RUE vis; LUE nwv                         (4CH, axis, and                         situs)  RVOT:                  Not well visualized    Lower Extremities:      RLE vis; LLE nwv  Other:  Nasal bone visualized. Technically difficult due to advanced GA and          fetal position. ---------------------------------------------------------------------- Cervix Uterus Adnexa  Cervix  Not visualized (advanced GA >24wks) ---------------------------------------------------------------------- Impression  Normal interval growth.  No ultrasonic evidence of structural  fetal anomalies.  Suboptimal views of the fetal anatomy was obtained  secondary to fetal position and late gestation.  Prior history of GDM ---------------------------------------------------------------------- Recommendations  Repeat anatomy in 4 weeks if undelivered. ----------------------------------------------------------------------  Lin Landsman, MD Electronically Signed Final Report   05/16/2019 07:24 am ----------------------------------------------------------------------   Assessment and Plan:  Pregnancy: G3P2002 at [redacted]w[redacted]d  1. History of gestational diabetes in prior pregnancy, currently pregnant  2. Previous cesarean delivery affecting pregnancy - desires repeat C/S and BTL  3. Unwanted fertility  4. Supervision of other normal pregnancy, antepartum   Term labor symptoms and general obstetric precautions including but not limited to vaginal bleeding, contractions, leaking of fluid and fetal movement were reviewed in detail with the patient. I discussed the assessment and treatment plan with the patient. The patient was provided an opportunity to ask questions and all were answered. The patient agreed with the plan and demonstrated an understanding of the instructions. The patient was advised to call back or seek an in-person office evaluation/go to MAU at [facility] for any urgent or concerning symptoms. Please refer to After Visit Summary for other counseling recommendations.   I provided 10 minutes of face-to-face time during this encounter.    Coral Ceo, MD Center for [facility], [facility] 05-29-2019

## 2019-05-30 ENCOUNTER — Encounter (HOSPITAL_COMMUNITY): Payer: Self-pay | Admitting: *Deleted

## 2019-05-30 ENCOUNTER — Other Ambulatory Visit: Payer: Self-pay

## 2019-05-30 ENCOUNTER — Inpatient Hospital Stay (HOSPITAL_COMMUNITY): Payer: BC Managed Care – PPO | Admitting: Certified Registered Nurse Anesthetist

## 2019-05-30 ENCOUNTER — Encounter (HOSPITAL_COMMUNITY)
Admission: RE | Disposition: A | Discharge status: Home / Self Care | Payer: Self-pay | Source: Home / Self Care | Attending: Obstetrics & Gynecology

## 2019-05-30 ENCOUNTER — Inpatient Hospital Stay (HOSPITAL_COMMUNITY)
Admission: RE | Admit: 2019-05-30 | Discharge: 2019-06-01 | DRG: 785 | Disposition: A | Discharge status: Home / Self Care | Payer: BC Managed Care – PPO | Attending: Obstetrics & Gynecology | Admitting: Obstetrics & Gynecology

## 2019-05-30 DIAGNOSIS — O34211 Maternal care for low transverse scar from previous cesarean delivery: Principal | ICD-10-CM | POA: Diagnosis present

## 2019-05-30 DIAGNOSIS — Z87891 Personal history of nicotine dependence: Secondary | ICD-10-CM

## 2019-05-30 DIAGNOSIS — Z302 Encounter for sterilization: Secondary | ICD-10-CM

## 2019-05-30 DIAGNOSIS — O34219 Maternal care for unspecified type scar from previous cesarean delivery: Secondary | ICD-10-CM | POA: Diagnosis present

## 2019-05-30 DIAGNOSIS — Z3A00 Weeks of gestation of pregnancy not specified: Secondary | ICD-10-CM | POA: Diagnosis not present

## 2019-05-30 DIAGNOSIS — Z8632 Personal history of gestational diabetes: Secondary | ICD-10-CM

## 2019-05-30 DIAGNOSIS — Z3A39 39 weeks gestation of pregnancy: Secondary | ICD-10-CM

## 2019-05-30 DIAGNOSIS — Z3480 Encounter for supervision of other normal pregnancy, unspecified trimester: Secondary | ICD-10-CM

## 2019-05-30 DIAGNOSIS — Z3009 Encounter for other general counseling and advice on contraception: Secondary | ICD-10-CM | POA: Diagnosis present

## 2019-05-30 DIAGNOSIS — O09299 Supervision of pregnancy with other poor reproductive or obstetric history, unspecified trimester: Secondary | ICD-10-CM

## 2019-05-30 DIAGNOSIS — Z98891 History of uterine scar from previous surgery: Secondary | ICD-10-CM

## 2019-05-30 DIAGNOSIS — Z9889 Other specified postprocedural states: Secondary | ICD-10-CM

## 2019-05-30 LAB — TYPE AND SCREEN
ABO/RH(D): A POS
Antibody Screen: NEGATIVE

## 2019-05-30 LAB — CBC
HCT: 33.8 % — ABNORMAL LOW (ref 36.0–46.0)
Hemoglobin: 11.3 g/dL — ABNORMAL LOW (ref 12.0–15.0)
MCH: 30.5 pg (ref 26.0–34.0)
MCHC: 33.4 g/dL (ref 30.0–36.0)
MCV: 91.4 fL (ref 80.0–100.0)
Platelets: 131 10*3/uL — ABNORMAL LOW (ref 150–400)
RBC: 3.7 MIL/uL — ABNORMAL LOW (ref 3.87–5.11)
RDW: 13 % (ref 11.5–15.5)
WBC: 8.3 10*3/uL (ref 4.0–10.5)
nRBC: 0 % (ref 0.0–0.2)

## 2019-05-30 LAB — ABO/RH: ABO/RH(D): A POS

## 2019-05-30 LAB — RPR: RPR Ser Ql: NONREACTIVE

## 2019-05-30 SURGERY — Surgical Case
Anesthesia: Spinal | Site: Abdomen | Laterality: Bilateral | Wound class: Clean Contaminated

## 2019-05-30 MED ORDER — SIMETHICONE 80 MG PO CHEW
80.0000 mg | CHEWABLE_TABLET | ORAL | Status: DC
  Administered 2019-05-30 – 2019-06-01 (×2): 80 mg via ORAL
  Filled 2019-05-30 (×2): qty 1

## 2019-05-30 MED ORDER — SODIUM CHLORIDE 0.9 % IV SOLN
INTRAVENOUS | Status: DC | PRN
  Administered 2019-05-30: 40 [IU] via INTRAVENOUS

## 2019-05-30 MED ORDER — NALOXONE HCL 0.4 MG/ML IJ SOLN: 0.4000 mg | INTRAMUSCULAR | Status: DC | PRN

## 2019-05-30 MED ORDER — NALBUPHINE HCL 10 MG/ML IJ SOLN: 5.0000 mg | INTRAMUSCULAR | Status: DC | PRN

## 2019-05-30 MED ORDER — DIPHENHYDRAMINE HCL 50 MG/ML IJ SOLN: 12.5000 mg | INTRAMUSCULAR | Status: DC | PRN

## 2019-05-30 MED ORDER — OXYCODONE HCL 5 MG PO TABS: 5.0000 mg | ORAL_TABLET | Freq: Once | ORAL | Status: AC | PRN

## 2019-05-30 MED ORDER — MEPERIDINE HCL 25 MG/ML IJ SOLN: 6.2500 mg | INTRAMUSCULAR | Status: DC | PRN

## 2019-05-30 MED ORDER — IBUPROFEN 800 MG PO TABS
800.0000 mg | ORAL_TABLET | Freq: Three times a day (TID) | ORAL | Status: DC
  Administered 2019-05-30 – 2019-06-01 (×4): 800 mg via ORAL
  Filled 2019-05-30 (×5): qty 1

## 2019-05-30 MED ORDER — ACETAMINOPHEN 325 MG PO TABS
650.0000 mg | ORAL_TABLET | Freq: Once | ORAL | Status: AC
  Administered 2019-05-30: 650 mg via ORAL
  Filled 2019-05-30: qty 2

## 2019-05-30 MED ORDER — COCONUT OIL OIL: 1.0000 "application " | TOPICAL_OIL | Status: DC | PRN

## 2019-05-30 MED ORDER — ONDANSETRON HCL 4 MG/2ML IJ SOLN
INTRAMUSCULAR | Status: AC
  Filled 2019-05-30: qty 2

## 2019-05-30 MED ORDER — TETANUS-DIPHTH-ACELL PERTUSSIS 5-2.5-18.5 LF-MCG/0.5 IM SUSP: 0.5000 mL | Freq: Once | INTRAMUSCULAR | Status: DC

## 2019-05-30 MED ORDER — SIMETHICONE 80 MG PO CHEW: 80.0000 mg | CHEWABLE_TABLET | ORAL | Status: DC | PRN

## 2019-05-30 MED ORDER — BUPIVACAINE IN DEXTROSE 0.75-8.25 % IT SOLN
INTRATHECAL | Status: DC | PRN
  Administered 2019-05-30: 1.8 mL via INTRATHECAL

## 2019-05-30 MED ORDER — SCOPOLAMINE 1 MG/3DAYS TD PT72
1.0000 | MEDICATED_PATCH | Freq: Once | TRANSDERMAL | Status: DC
  Administered 2019-05-30: 1.5 mg via TRANSDERMAL

## 2019-05-30 MED ORDER — LACTATED RINGERS IV BOLUS
500.0000 mL | Freq: Once | INTRAVENOUS | Status: AC
  Administered 2019-05-30: 500 mL via INTRAVENOUS

## 2019-05-30 MED ORDER — FENTANYL CITRATE (PF) 100 MCG/2ML IJ SOLN: 25.0000 ug | INTRAMUSCULAR | Status: DC | PRN

## 2019-05-30 MED ORDER — LACTATED RINGERS IV SOLN
INTRAVENOUS | Status: DC
  Administered 2019-05-30 (×2): via INTRAVENOUS

## 2019-05-30 MED ORDER — HYDROCODONE-ACETAMINOPHEN 5-325 MG PO TABS
1.0000 | ORAL_TABLET | ORAL | Status: DC | PRN
  Administered 2019-05-30 – 2019-06-01 (×8): 2 via ORAL
  Filled 2019-05-30 (×9): qty 2

## 2019-05-30 MED ORDER — OXYCODONE HCL 5 MG/5ML PO SOLN
ORAL | Status: AC
  Filled 2019-05-30: qty 5

## 2019-05-30 MED ORDER — MENTHOL 3 MG MT LOZG: 1.0000 | LOZENGE | OROMUCOSAL | Status: DC | PRN

## 2019-05-30 MED ORDER — SCOPOLAMINE 1 MG/3DAYS TD PT72
MEDICATED_PATCH | TRANSDERMAL | Status: AC
  Filled 2019-05-30: qty 1

## 2019-05-30 MED ORDER — DIPHENHYDRAMINE HCL 25 MG PO CAPS: 25.0000 mg | ORAL_CAPSULE | ORAL | Status: DC | PRN

## 2019-05-30 MED ORDER — NALBUPHINE HCL 10 MG/ML IJ SOLN: 5.0000 mg | Freq: Once | INTRAMUSCULAR | Status: DC | PRN

## 2019-05-30 MED ORDER — PRENATAL MULTIVITAMIN CH
1.0000 | ORAL_TABLET | Freq: Every day | ORAL | Status: DC
  Filled 2019-05-30: qty 1

## 2019-05-30 MED ORDER — MORPHINE SULFATE (PF) 0.5 MG/ML IJ SOLN
INTRAMUSCULAR | Status: AC
  Filled 2019-05-30: qty 10

## 2019-05-30 MED ORDER — STERILE WATER FOR IRRIGATION IR SOLN
Status: DC | PRN
  Administered 2019-05-30: 1000 mL

## 2019-05-30 MED ORDER — FENTANYL CITRATE (PF) 100 MCG/2ML IJ SOLN
INTRAMUSCULAR | Status: AC
  Filled 2019-05-30: qty 2

## 2019-05-30 MED ORDER — SODIUM CHLORIDE 0.9% FLUSH: 3.0000 mL | INTRAVENOUS | Status: DC | PRN

## 2019-05-30 MED ORDER — ONDANSETRON HCL 4 MG/2ML IJ SOLN
INTRAMUSCULAR | Status: DC | PRN
  Administered 2019-05-30: 4 mg via INTRAVENOUS

## 2019-05-30 MED ORDER — SOD CITRATE-CITRIC ACID 500-334 MG/5ML PO SOLN
30.0000 mL | ORAL | Status: AC
  Administered 2019-05-30: 30 mL via ORAL

## 2019-05-30 MED ORDER — DIBUCAINE (PERIANAL) 1 % EX OINT: 1.0000 "application " | TOPICAL_OINTMENT | CUTANEOUS | Status: DC | PRN

## 2019-05-30 MED ORDER — MORPHINE SULFATE (PF) 0.5 MG/ML IJ SOLN
INTRAMUSCULAR | Status: DC | PRN
  Administered 2019-05-30: .15 mg via INTRATHECAL

## 2019-05-30 MED ORDER — DIPHENHYDRAMINE HCL 25 MG PO CAPS: 25.0000 mg | ORAL_CAPSULE | Freq: Four times a day (QID) | ORAL | Status: DC | PRN

## 2019-05-30 MED ORDER — IBUPROFEN 600 MG PO TABS: 600.0000 mg | ORAL_TABLET | Freq: Four times a day (QID) | ORAL | 1 refills | Status: AC | PRN

## 2019-05-30 MED ORDER — OXYCODONE-ACETAMINOPHEN 5-325 MG PO TABS: 1.0000 | ORAL_TABLET | Freq: Four times a day (QID) | ORAL | 0 refills | Status: AC | PRN

## 2019-05-30 MED ORDER — WITCH HAZEL-GLYCERIN EX PADS: 1.0000 "application " | MEDICATED_PAD | CUTANEOUS | Status: DC | PRN

## 2019-05-30 MED ORDER — OXYTOCIN 40 UNITS IN NORMAL SALINE INFUSION - SIMPLE MED: 2.5000 [IU]/h | INTRAVENOUS | Status: AC

## 2019-05-30 MED ORDER — OXYCODONE HCL 5 MG/5ML PO SOLN
5.0000 mg | Freq: Once | ORAL | Status: AC | PRN
  Administered 2019-05-30: 5 mg via ORAL

## 2019-05-30 MED ORDER — LACTATED RINGERS IV SOLN
INTRAVENOUS | Status: DC
  Administered 2019-05-30 – 2019-05-31 (×2): via INTRAVENOUS

## 2019-05-30 MED ORDER — OXYTOCIN 40 UNITS IN NORMAL SALINE INFUSION - SIMPLE MED
INTRAVENOUS | Status: AC
  Filled 2019-05-30: qty 1000

## 2019-05-30 MED ORDER — SIMETHICONE 80 MG PO CHEW
80.0000 mg | CHEWABLE_TABLET | Freq: Three times a day (TID) | ORAL | Status: DC
  Administered 2019-05-30 – 2019-06-01 (×5): 80 mg via ORAL
  Filled 2019-05-30 (×5): qty 1

## 2019-05-30 MED ORDER — PHENYLEPHRINE HCL-NACL 20-0.9 MG/250ML-% IV SOLN
INTRAVENOUS | Status: AC
  Filled 2019-05-30: qty 250

## 2019-05-30 MED ORDER — SODIUM CHLORIDE 0.9 % IR SOLN
Status: DC | PRN
  Administered 2019-05-30: 1000 mL

## 2019-05-30 MED ORDER — CEFAZOLIN SODIUM-DEXTROSE 2-4 GM/100ML-% IV SOLN
2.0000 g | INTRAVENOUS | Status: AC
  Administered 2019-05-30: 2 g via INTRAVENOUS

## 2019-05-30 MED ORDER — CEFAZOLIN SODIUM-DEXTROSE 2-4 GM/100ML-% IV SOLN
INTRAVENOUS | Status: AC
  Filled 2019-05-30: qty 100

## 2019-05-30 MED ORDER — NALOXONE HCL 4 MG/10ML IJ SOLN
1.0000 ug/kg/h | INTRAVENOUS | Status: DC | PRN
  Filled 2019-05-30: qty 5

## 2019-05-30 MED ORDER — SENNOSIDES-DOCUSATE SODIUM 8.6-50 MG PO TABS
2.0000 | ORAL_TABLET | ORAL | Status: DC
  Administered 2019-05-30 – 2019-06-01 (×2): 2 via ORAL
  Filled 2019-05-30 (×2): qty 2

## 2019-05-30 MED ORDER — BUPIVACAINE HCL (PF) 0.5 % IJ SOLN
INTRAMUSCULAR | Status: AC
  Filled 2019-05-30: qty 30

## 2019-05-30 MED ORDER — SODIUM CHLORIDE 0.9 % IV SOLN
INTRAVENOUS | Status: DC | PRN
  Administered 2019-05-30: 60 ug/min via INTRAVENOUS

## 2019-05-30 MED ORDER — SODIUM CHLORIDE 0.9 % IV SOLN
INTRAVENOUS | Status: DC | PRN
  Administered 2019-05-30: 10:00:00 via INTRAVENOUS

## 2019-05-30 MED ORDER — KETOROLAC TROMETHAMINE 30 MG/ML IJ SOLN
30.0000 mg | Freq: Once | INTRAMUSCULAR | Status: AC | PRN
  Administered 2019-05-30: 30 mg via INTRAVENOUS

## 2019-05-30 MED ORDER — KETOROLAC TROMETHAMINE 30 MG/ML IJ SOLN
INTRAMUSCULAR | Status: AC
  Filled 2019-05-30: qty 1

## 2019-05-30 MED ORDER — BUPIVACAINE HCL (PF) 0.5 % IJ SOLN
INTRAMUSCULAR | Status: DC | PRN
  Administered 2019-05-30: 30 mL

## 2019-05-30 MED ORDER — ONDANSETRON HCL 4 MG/2ML IJ SOLN: 4.0000 mg | Freq: Once | INTRAMUSCULAR | Status: DC | PRN

## 2019-05-30 MED ORDER — FENTANYL CITRATE (PF) 100 MCG/2ML IJ SOLN
INTRAMUSCULAR | Status: DC | PRN
  Administered 2019-05-30: 15 ug via INTRATHECAL

## 2019-05-30 MED ORDER — SOD CITRATE-CITRIC ACID 500-334 MG/5ML PO SOLN
ORAL | Status: AC
  Filled 2019-05-30: qty 30

## 2019-05-30 MED ORDER — ZOLPIDEM TARTRATE 5 MG PO TABS: 5.0000 mg | ORAL_TABLET | Freq: Every evening | ORAL | Status: DC | PRN

## 2019-05-30 MED ORDER — ONDANSETRON HCL 4 MG/2ML IJ SOLN: 4.0000 mg | Freq: Three times a day (TID) | INTRAMUSCULAR | Status: DC | PRN

## 2019-05-30 SURGICAL SUPPLY — 32 items
CHLORAPREP W/TINT 26ML (MISCELLANEOUS) ×3 IMPLANT
CLAMP CORD UMBIL (MISCELLANEOUS) ×2 IMPLANT
CLIP FILSHIE TUBAL LIGA STRL (Clip) ×2 IMPLANT
CLOSURE WOUND 1/2 X4 (GAUZE/BANDAGES/DRESSINGS) ×1
CLOTH BEACON ORANGE TIMEOUT ST (SAFETY) ×3 IMPLANT
DRSG OPSITE POSTOP 4X10 (GAUZE/BANDAGES/DRESSINGS) ×3 IMPLANT
ELECT REM PT RETURN 9FT ADLT (ELECTROSURGICAL) ×3
ELECTRODE REM PT RTRN 9FT ADLT (ELECTROSURGICAL) ×1 IMPLANT
GLOVE BIO SURGEON STRL SZ 6.5 (GLOVE) ×2 IMPLANT
GLOVE BIO SURGEONS STRL SZ 6.5 (GLOVE) ×1
GLOVE BIOGEL PI IND STRL 7.0 (GLOVE) ×1 IMPLANT
GLOVE BIOGEL PI INDICATOR 7.0 (GLOVE) ×2
GOWN STRL REUS W/TWL LRG LVL3 (GOWN DISPOSABLE) ×6 IMPLANT
NDL SPNL 18GX3.5 QUINCKE PK (NEEDLE) ×1 IMPLANT
NEEDLE SPNL 18GX3.5 QUINCKE PK (NEEDLE) ×3 IMPLANT
NS IRRIG 1000ML POUR BTL (IV SOLUTION) ×3 IMPLANT
PACK C SECTION WH (CUSTOM PROCEDURE TRAY) ×3 IMPLANT
PAD OB MATERNITY 4.3X12.25 (PERSONAL CARE ITEMS) ×3 IMPLANT
PENCIL SMOKE EVAC W/HOLSTER (ELECTROSURGICAL) ×3 IMPLANT
STRIP CLOSURE SKIN 1/2X4 (GAUZE/BANDAGES/DRESSINGS) ×1 IMPLANT
SUT PDS AB 0 CTX 60 (SUTURE) ×2 IMPLANT
SUT VIC AB 0 CT1 27 (SUTURE)
SUT VIC AB 0 CT1 27XBRD ANBCTR (SUTURE) IMPLANT
SUT VIC AB 2-0 CT1 27 (SUTURE) ×2
SUT VIC AB 2-0 CT1 TAPERPNT 27 (SUTURE) ×1 IMPLANT
SUT VIC AB 2-0 CTX 36 (SUTURE) ×6 IMPLANT
SUT VIC AB 3-0 CT1 27 (SUTURE) ×2
SUT VIC AB 3-0 CT1 TAPERPNT 27 (SUTURE) ×1 IMPLANT
SYR 30ML LL (SYRINGE) ×3 IMPLANT
TOWEL OR 17X24 6PK STRL BLUE (TOWEL DISPOSABLE) ×3 IMPLANT
TRAY FOLEY W/BAG SLVR 14FR LF (SET/KITS/TRAYS/PACK) ×3 IMPLANT
WATER STERILE IRR 1000ML POUR (IV SOLUTION) ×3 IMPLANT

## 2019-05-30 NOTE — Discharge Summary (Addendum)
Postpartum Discharge Summary     Patient Name: Janet Ortiz DOB: December 06, 1985 MRN: [id number]  Date of admission: 05/30/2019 Delivering Provider: Emily Filbert   Date of discharge: 06/01/2019  Admitting diagnosis: RCS Undesired Fertility Intrauterine pregnancy: [redacted]w[redacted]d     Secondary diagnosis:       Discharge diagnosis: Term Pregnancy Delivered                                                                                                 Complications: None  Hospital course:  Sceduled C/S   33 y.o. yo G3P3003 at [redacted]w[redacted]d was admitted to the hospital 05/30/2019 for scheduled cesarean section with the following indication:Elective Repeat.  Membrane Rupture Time/Date: 9:53 AM ,05/30/2019   Patient delivered a Viable infant.05/30/2019  Details of operation can be found in separate operative note.  Pateint had an uncomplicated postpartum course.  She is ambulating, tolerating a regular diet, passing flatus, and urinating well. Patient is discharged home in stable condition on  06/01/19         Magnesium Sulfate recieved: No BMZ received: No  Physical exam  Vitals:   05/31/19 0600 05/31/19 2217 06/01/19 0025 06/01/19 0614  BP: 103/86 112/83  110/74  Pulse: 65 71  69  Resp: 18 18  18   Temp: 98 F (36.7 C) 98.1 F (36.7 C)  97.7 F (36.5 C)  TempSrc: Oral Axillary  Oral  SpO2: 100%  94% 98%  Weight:      Height:       General: alert Lochia: appropriate Uterine Fundus: firm Incision: Dressing is clean, dry, and intact DVT Evaluation: No evidence of DVT seen on physical exam. Labs: Lab Results  Component Value Date   WBC 8.0 05/31/2019   HGB 9.1 (L) 05/31/2019   HCT 27.2 (L) 05/31/2019   MCV 92.2 05/31/2019   PLT 118 (L) 05/31/2019   No flowsheet data found.  Discharge instruction: per After Visit Summary and "Baby and Me Booklet".  After visit meds:  Allergies as of 06/01/2019   No Known Allergies     Medication List    STOP taking these medications   [place] these medications   ibuprofen 600 MG tablet Commonly known as: ADVIL Take 1 tablet (600 mg total) by mouth every 6 (six) hours as needed.   multivitamin-prenatal 27-0.8 MG Tabs tablet Take 1 tablet by mouth daily.   oxyCODONE-acetaminophen 5-325 MG tablet Commonly known as: PERCOCET/ROXICET Take 1 tablet by mouth every 6 (six) hours as needed.       Diet: routine diet  Activity: Advance as tolerated. Pelvic rest for 6 weeks.   Outpatient follow up:2 weeks Follow up Appt:No future appointments. Follow up Visit: [place]. Schedule an appointment as soon as possible for a visit today.   Specialty: Obstetrics and Gynecology Why: 2 weeks for in person incision check and 4 weeks virtual postpartum visit Contact information: [address] [place] [phone]  Please schedule this patient for Postpartum visit in: 2 weeks incision check and 4 weeks virtual PP exam with the following provider: Any provider For C/S patients schedule nurse incision check in weeks 2 weeks: yes Low risk pregnancy complicated by: none Delivery mode:  CS Anticipated Birth Control:  BTL done PP PP Procedures needed: Incision check  Schedule Integrated BH visit: no      Newborn Data: Live born female  Birth Weight:  4370 g APGAR: 8/8  Newborn Delivery   Birth date/time: 05/30/2019 09:54:00 Delivery type: C-Section, Low Transverse Trial of labor: No C-section categorization: Repeat      Baby Feeding: pumping and bottle feeding Disposition: home   06/01/2019 Janet Planachel E Kim, MD    Provider attestation I have seen and examined this patient and agree with above documentation in the resident's note.   Janet Ortiz is a 33 y.o. [phone] s/p LTCS & BTL.  Pain is well controlled. Plan for birth control is bilateral tubal ligation. Method of Feeding:  breast & bottle  PE:  Gen: well appearing Heart: reg rate Lungs: normal WOB Fundus firm Ext: no pain, no edema  Recent Labs    05/30/19 0734 05/31/19 0500  HGB 11.3* 9.1*  HCT 33.8* 27.2*    Assessment S/p LTCS & BTL PPD # 2  Plan: - discharge home - postpartum care discussed - f/u in office 2 week incision check & 4 wk PP  Janet HornErin Jaxson Anglin, NP 9:06 AM

## 2019-05-30 NOTE — H&P (Signed)
Janet Ortiz is a 33 y.o. married P2 at 24 weeks female presenting for  Repeat cesarean section and tubal ligation.  OB History    Gravida  3   Para  2   Term  2   Preterm      AB      Living  2     SAB      TAB      Ectopic      Multiple      Live Births  2          History reviewed. No pertinent past medical history. Past Surgical History:  Procedure Laterality Date  . CESAREAN SECTION     Family History: family history includes Diabetes in her maternal grandmother and mother. Social History:  reports that she has quit smoking. She has never used smokeless tobacco. She reports previous alcohol use. She reports that she does not use drugs.     Maternal Diabetes: No Genetic Screening: Normal Maternal Ultrasounds/Referrals: Normal Fetal Ultrasounds or other Referrals:  None Maternal Substance Abuse:  No Significant Maternal Medications:  None Significant Maternal Lab Results:  None Other Comments:  None  ROS History   Blood pressure 123/81, pulse 77, temperature 98.7 F (37.1 C), temperature source Oral, resp. rate 18, height 5\' 6"  (1.676 m), weight 88.5 kg, last menstrual period 08/30/2018, SpO2 97 %. Exam Physical Exam  Breathing, conversing, and ambulating normally Well nourished, well hydrated White female, no apparent distress Heart- rrr Lungs- CTAB Abd- benign, gravid Prenatal labs: ABO, Rh: --/--/A POS, PENDING (06/26 0734) Antibody: NEG (06/26 0734) Rubella:   RPR: Non Reactive (04/09 0832)  HBsAg: Negative (01/04 0000)  HIV: Non Reactive (04/09 3329)  GBS: Negative (06/11 0418)   Assessment/Plan: 39 weeks with h/o 2 previous cesarean sections and unwanted fertility Plan for RLTC and BTL  She understands the risks of surgery, including, but not to infection, bleeding, DVTs, damage to bowel, bladder, ureters. She wishes to proceed.      Emily Filbert 05/30/2019, 8:54 AM

## 2019-05-30 NOTE — Op Note (Signed)
05/30/2019  10:20 AM  PATIENT:  Janet Ortiz  33 y.o. female  PRE-OPERATIVE DIAGNOSIS:  Repeat Cesarean Section, Undesired Fertility  POST-OPERATIVE DIAGNOSIS:  Repeat Cesarean Section, Undesired Fertility  PROCEDURE:  Procedure(s): CESAREAN SECTION WITH BILATERAL TUBAL LIGATION (Bilateral)  SURGEON:  Surgeon(s) and Role:    * Hulan Fray, Wilhemina Cash, MD - Primary    * Chancy Milroy, MD - Assisting  ANESTHESIA:   local and spinal  EBL:  464   BLOOD ADMINISTERED:none  DRAINS: Urinary Catheter (Foley)   LOCAL MEDICATIONS USED:  MARCAINE     SPECIMEN:  Source of Specimen:  cord blood  DISPOSITION OF SPECIMEN:  PATHOLOGY  COUNTS:  YES  TOURNIQUET:  * No tourniquets in log *  DICTATION: .Dragon Dictation  PLAN OF CARE: Admit to inpatient   PATIENT DISPOSITION:  PACU - hemodynamically stable.   Delay start of Pharmacological VTE agent (>24hrs) due to surgical blood loss or risk of bleeding: not applicable  The risks, benefits, and alternatives of surgery were explained, understood, accepted. Consents were signed. All questions were answered. In the operating room spinal anesthesia was applied without complication. Her abdomen and vagina were prepped and draped in the usual sterile fashion. A Foley catheter was placed, draining clear urine throughout case. Timeout procedure was done. After adequate anesthesia was assured 30 mL for 0.5% Marcaine was injected into the subcutaneous tissue at the site of her previous cesarean scars. An incision was made through the previous incision. The incision was carried down through the subcutaneous tissue to the fascia. The fascia was scored the midline and extended bilaterally. The middle 20% of the rectus muscles were separated in a transverse fashion using electrosurgical technique. Excellent hemostasis was maintained. The peritoneum was entered with hemostats. Peritoneal incision was extended bilaterally with the Bovie. The bladder blade was placed.  A transverse incision was made on the nondeveloped lower uterine segment. The uterine incision was extended with bandage scissors and traction on each side. Amniotomy was performed with a hemostat. Clear fluid was noted. The baby was delivered from a vertex presentation.the mouth and nostrils were suctioned prior to delivery of the shoulders.  The baby's cord was clamped and cut and was transferred to the NICU personnel for routine care after the 1 minute delayed cord clamping was observed. The placenta was delivered intact with traction. The uterus was left in situ and the interior was cleaned with a dry lap sponge. The uterine incision was closed with 2-0 Vicryl running locking suture. Excellent hemostasis was noted. By tilting the uterus each side was able to visualize the adnexa, and they were normal. I placed a Filsche clamp in the isthmic region across each oviduct. I closed the peritoneum with a running non-locking 2-0 vicryl suture. The rectus fascia rectus muscles were noted be hemostatic as well. The fascia was closed with a #1 PDS loop in a running nonlocking fashion. No defects were palpable. The subcutaneous tissue was irrigated, clean, and dried. A subcuticular closure was done with a 3-0 Vicryl suture. Steri-Strips are placed. Excellent cosmetic results were obtained. She was taken to the recovery room in stable condition. She tolerated the procedure well.

## 2019-05-30 NOTE — Anesthesia Preprocedure Evaluation (Signed)
Anesthesia Evaluation  Patient identified by MRN, date of birth, ID band Patient awake    Reviewed: Allergy & Precautions, H&P , NPO status , Patient's Chart, lab work & pertinent test results  History of Anesthesia Complications Negative for: history of anesthetic complications  Airway Mallampati: II  TM Distance: >3 FB Neck ROM: full    Dental no notable dental hx.    Pulmonary neg pulmonary ROS, former smoker,    Pulmonary exam normal        Cardiovascular negative cardio ROS Normal cardiovascular exam     Neuro/Psych negative neurological ROS  negative psych ROS   GI/Hepatic negative GI ROS, Neg liver ROS,   Endo/Other  negative endocrine ROS  Renal/GU negative Renal ROS  negative genitourinary   Musculoskeletal   Abdominal   Peds  Hematology negative hematology ROS (+)   Anesthesia Other Findings   Reproductive/Obstetrics (+) Pregnancy                             Anesthesia Physical Anesthesia Plan  ASA: II  Anesthesia Plan: Spinal   Post-op Pain Management:    Induction:   PONV Risk Score and Plan: Ondansetron and Treatment may vary due to age or medical condition  Airway Management Planned:   Additional Equipment:   Intra-op Plan:   Post-operative Plan:   Informed Consent: I have reviewed the patients History and Physical, chart, labs and discussed the procedure including the risks, benefits and alternatives for the proposed anesthesia with the patient or authorized representative who has indicated his/her understanding and acceptance.       Plan Discussed with:   Anesthesia Plan Comments:         Anesthesia Quick Evaluation  

## 2019-05-30 NOTE — Anesthesia Postprocedure Evaluation (Signed)
Anesthesia Post Note  Patient: Ilina Xu  Procedure(s) Performed: CESAREAN SECTION WITH BILATERAL TUBAL LIGATION (Bilateral Abdomen)     Patient location during evaluation: PACU Anesthesia Type: Spinal Level of consciousness: oriented and awake and alert Pain management: pain level controlled Vital Signs Assessment: post-procedure vital signs reviewed and stable Respiratory status: spontaneous breathing, respiratory function stable and nonlabored ventilation Cardiovascular status: blood pressure returned to baseline and stable Postop Assessment: no headache, no backache, no apparent nausea or vomiting and spinal receding Anesthetic complications: no    Last Vitals:  Vitals:   05/30/19 1100 05/30/19 1124  BP: 109/63   Pulse: 63 61  Resp: 18 17  Temp: 36.5 C   SpO2: 100% 100%    Last Pain:  Vitals:   05/30/19 1124  TempSrc:   PainSc: 2    Pain Goal:    LLE Motor Response: Purposeful movement (05/30/19 1115)   RLE Motor Response: Purposeful movement (05/30/19 1115)       Epidural/Spinal Function Cutaneous sensation: Tingles (05/30/19 1115), Patient able to flex knees: No (05/30/19 1115), Patient able to lift hips off bed: No (05/30/19 1115), Back pain beyond tenderness at insertion site: No (05/30/19 1115), Progressively worsening motor and/or sensory loss: No (05/30/19 1115), Bowel and/or bladder incontinence post epidural: No (05/30/19 1115)  Lidia Collum

## 2019-05-30 NOTE — Transfer of Care (Signed)
Immediate Anesthesia Transfer of Care Note  Patient: Janet Ortiz  Procedure(s) Performed: CESAREAN SECTION WITH BILATERAL TUBAL LIGATION (Bilateral Abdomen)  Patient Location: PACU  Anesthesia Type:Spinal  Level of Consciousness: awake, alert  and oriented  Airway & Oxygen Therapy: Patient Spontanous Breathing  Post-op Assessment: Report given to RN and Post -op Vital signs reviewed and stable  Post vital signs: Reviewed and stable  Last Vitals:  Vitals Value Taken Time  BP 107/61 05/30/19 1029  Temp    Pulse 80 05/30/19 1034  Resp 14 05/30/19 1034  SpO2 98 % 05/30/19 1034  Vitals shown include unvalidated device data.  Last Pain:  Vitals:   05/30/19 0736  TempSrc: Oral         Complications: No apparent anesthesia complications

## 2019-05-30 NOTE — Discharge Instructions (Signed)

## 2019-05-30 NOTE — Anesthesia Procedure Notes (Signed)

## 2019-05-31 ENCOUNTER — Encounter (HOSPITAL_COMMUNITY): Payer: Self-pay | Admitting: *Deleted

## 2019-05-31 LAB — CBC
HCT: 27.2 % — ABNORMAL LOW (ref 36.0–46.0)
Hemoglobin: 9.1 g/dL — ABNORMAL LOW (ref 12.0–15.0)
MCH: 30.8 pg (ref 26.0–34.0)
MCHC: 33.5 g/dL (ref 30.0–36.0)
MCV: 92.2 fL (ref 80.0–100.0)
Platelets: 118 10*3/uL — ABNORMAL LOW (ref 150–400)
RBC: 2.95 MIL/uL — ABNORMAL LOW (ref 3.87–5.11)
RDW: 13.2 % (ref 11.5–15.5)
WBC: 8 10*3/uL (ref 4.0–10.5)
nRBC: 0 % (ref 0.0–0.2)

## 2019-05-31 NOTE — Lactation Note (Signed)
This note was copied from a baby's chart. Lactation Consultation Note Baby 40 hrs old. Has fed on the breast and mom has given colostrum that she pumped. Baby not interested in feeding at midnight and at this time. LC changed stool diaper. Baby placed to breast in cradle position wouldn't feed. Mom got nipple in baby's mouth, no suck noted. Mom has 15 and 34 yr old that she BF and bottle fed BM. Mom stated she mostly pumped and bottle fed BM. Mom has all ready pumped 5 ml and gave to baby. Noted recessed chin to baby. Mention to mom may need chin tug after latching to insure wide flange. Encouraged to call for assistance when baby is interested in feeding if needs help. Mom has everted nipples.  Newborn feeding habits, STS, I&O supply and demand reviewed. Asked mom to write attempted when tries to feed and will not. Lactation brochure given.  Patient Name: Janet Ortiz [id number] Date: 05/31/2019 Reason for consult: Initial assessment;Term   Maternal Data Has patient been taught Hand Expression?: Yes Does the patient have breastfeeding experience prior to this delivery?: Yes  Feeding Feeding Type: Breast Milk  LATCH Score Latch: Too sleepy or reluctant, no latch achieved, no sucking elicited.  Audible Swallowing: None  Type of Nipple: Everted at rest and after stimulation  Comfort (Breast/Nipple): Soft / non-tender  Hold (Positioning): Assistance needed to correctly position infant at breast and maintain latch.  LATCH Score: 5  Interventions Interventions: Breast feeding basics reviewed;Adjust position;DEBP;Assisted with latch;Support pillows;Skin to skin;Position options;Breast massage;Breast compression  Lactation Tools Discussed/Used Tools: Pump Breast pump type: Double-Electric Breast Pump WIC Program: No Pump Review: Setup, frequency, and cleaning;Milk Storage Initiated by:: RN Date initiated:: 05/30/19   Consult Status Consult Status: Follow-up Date:  06/01/19 Follow-up type: In-patient    Theodoro Kalata 05/31/2019, 1:59 AM

## 2019-05-31 NOTE — Progress Notes (Addendum)
Post Op Day 1  Subjective Voiding, tolerating PO. Has not yet been up and walking around.  Denies dizziness, lightheadedness, tachycardia. Appropriate Lochia. Pain well controlled.  Objective BP (!) 105/57 (BP Location: Left Arm)   Pulse 70   Temp 98.3 F (36.8 C) (Oral)   Resp 18   Ht 5\' 6"  (1.676 m)   Wt 88.5 kg   LMP 08/30/2018 (Exact Date)   SpO2 100%   Breastfeeding Unknown   BMI 31.47 kg/m  Intake/Output      06/26 0701 - 06/27 0700   I.V. (mL/kg) 3402.6 (38.4)   IV Piggyback 247.2   Total Intake(mL/kg) 3649.9 (41.2)   Urine (mL/kg/hr) 2475   Blood 506   Total Output 2981   Net +668.9         Physical Exam:  General: alert, cooperative, appears stated age, fatigued, and no distress Lungs: CTAB, no crackles or wheezes Heart: RRR, no m/r/r.  Lochia: appropriate Uterine Fundus: firm Incision: no significant drainage DVT Evaluation: No evidence of DVT seen on physical exam, extremities warm and well perfused.   Recent Labs    05/30/19 0734 05/31/19 0500  HGB 11.3* 9.1*  HCT 33.8* 27.2*    Assessment & Plan Post Op Day # 1 . Plan for dc in 1-2 days. . Continue daily lactation  . Contraception: s/p BTL . Keep incision site clean and dry . Circumcision: inpatient  . Good UOP, d/c foley catheter . D/c LRs as patient taking PO fluids well, d/c IV  . Up and OOB ad lib   LOS: 1 day   Janet Ortiz 05/31/2019, 6:39 AM

## 2019-06-13 ENCOUNTER — Encounter: Payer: Self-pay | Admitting: Obstetrics & Gynecology

## 2019-06-13 ENCOUNTER — Ambulatory Visit (INDEPENDENT_AMBULATORY_CARE_PROVIDER_SITE_OTHER): Payer: BC Managed Care – PPO | Admitting: Obstetrics & Gynecology

## 2019-06-13 ENCOUNTER — Other Ambulatory Visit: Payer: Self-pay

## 2019-06-13 VITALS — BP 113/77 | HR 80 | Ht 66.0 in | Wt 186.2 lb

## 2019-06-13 DIAGNOSIS — Z9889 Other specified postprocedural states: Secondary | ICD-10-CM

## 2019-06-13 NOTE — Progress Notes (Signed)
   Subjective:    Patient ID: Janet Ortiz, female    DOB: 03-02-1986, 33 y.o.   MRN: [id number]  HPI This 33 yo lady is here her a 2 week incision check. She is having no problems.    Review of Systems     Objective:   Physical Exam Breathing, conversing, and ambulating normally Well nourished, well hydrated White female, no apparent distress Abd- benign Incision- healed well      Assessment & Plan:  Post op incision check- doing well Come back for 4-6 week pp visit

## 2019-06-13 NOTE — Progress Notes (Signed)
2 weeks PP presented for Incision check, reports no problems today.  She had BTL done at delivery.

## 2019-06-27 ENCOUNTER — Encounter: Payer: Self-pay | Admitting: Obstetrics and Gynecology

## 2019-06-27 ENCOUNTER — Ambulatory Visit (INDEPENDENT_AMBULATORY_CARE_PROVIDER_SITE_OTHER): Payer: BC Managed Care – PPO | Admitting: Obstetrics and Gynecology

## 2019-06-27 DIAGNOSIS — Z9851 Tubal ligation status: Secondary | ICD-10-CM

## 2019-06-27 NOTE — Progress Notes (Signed)
TELEHEALTH POSTPARTUM VIRTUAL VIDEO VISIT ENCOUNTER NOTE   Provider location: Center for [facility] at [place]   I connected with Janet Ortiz on 06/27/19 at 10:00 AM EDT by WebEx Encounter at home and verified that I am speaking with the correct person using two identifiers.    I discussed the limitations, risks, security and privacy concerns of performing an evaluation and management service virtually and the availability of in person appointments. I also discussed with the patient that there may be a patient responsible charge related to this service. The patient expressed understanding and agreed to proceed.  Chief Complaint: Postpartum Visit  History of Present Illness: Janet Ortiz is a 33 y.o. Caucasian [id number] being evaluated for postpartum followup.    She is s/p repeat cesarean section with BTL on 05/30/19 at 39 weeks; she was discharged to home on POD#2. Pregnancy was uncomplicated. Baby is doing well.  Pt is doing well. No bowel or bladder dysfunction. Breast pumping.   Vaginal bleeding or discharge: No  Intercourse: No  Contraception: bilateral tubal ligation Mode of feeding infant: Breast PP depression s/s: No .  Any bowel or bladder issues: No  Pap smear: 08/2017 normal  Review of Systems:  Her 12 point review of systems is negative or as noted in the History of Present Illness.  Patient Active Problem List   Diagnosis Date Noted  . Postpartum care following cesarean delivery 06/27/2019  . H/O tubal ligation 06/27/2019  . Post-operative state 05/30/2019    Medications Janet Ortiz had no medications administered during this visit. Current Outpatient Medications  Medication Sig Dispense Refill  . ibuprofen (ADVIL) 600 MG tablet Take 1 tablet (600 mg total) by mouth every 6 (six) hours as needed. (Patient not taking: Reported on 06/27/2019) 60 tablet 1  . oxyCODONE-acetaminophen (PERCOCET/ROXICET) 5-325 MG tablet Take 1 tablet by mouth every 6 (six) hours as  needed. (Patient not taking: Reported on 06/27/2019) 30 tablet 0  . Prenatal Vit-Fe Fumarate-FA (MULTIVITAMIN-PRENATAL) 27-0.8 MG TABS tablet Take 1 tablet by mouth daily.      No current facility-administered medications for this visit.     Allergies Patient has no known allergies.  Physical Exam:  BP 104/64   Pulse 83   LMP 08/30/2018 (Exact Date)   Breastfeeding Yes   General:  Alert, oriented and cooperative. Patient is in no acute distress.  Mental Status: Normal mood and affect. Normal behavior. Normal judgment and thought content.   Respiratory: Normal respiratory effort noted, no problems with respiration noted  Rest of physical exam deferred due to type of encounter  PP Depression Screening:   Edinburgh Postnatal Depression Scale Screening Tool 06/27/2019 05/30/2019 05/30/2019  I have been able to laugh and see the funny side of things. 0 0 (No Data)  I have looked forward with enjoyment to things. 0 0 -  I have blamed myself unnecessarily when things went wrong. 0 1 -  I have been anxious or worried for no good reason. 0 1 -  I have felt scared or panicky for no good reason. 0 1 -  Things have been getting on top of me. 0 1 -  I have been so unhappy that I have had difficulty sleeping. 0 1 -  I have felt sad or miserable. 0 1 -  I have been so unhappy that I have been crying. 0 0 -  The thought of harming myself has occurred to me. 0 0 -  Edinburgh Postnatal Depression Scale Total  0 6 -     Assessment:Patient is a 33 y.o. [id number] who is 4 weeks postpartum from a repeat cesarean section and tubal ligation was performed.  She is doing well.   Plan:  1. Postpartum care following cesarean delivery Return to nl ADL's  2. H/O tubal ligation    RTC 1 yr or PRN  I discussed the assessment and treatment plan with the patient. The patient was provided an opportunity to ask questions and all were answered. The patient agreed with the plan and demonstrated an understanding of  the instructions.   The patient was advised to call back or seek an in-person evaluation/go to the ED for any concerning postpartum symptoms.  I provided 10 minutes of face-to-face time during this encounter.   Hermina StaggersMichael L Ervin, MD [facility], [facility]

## 2019-11-04 IMAGING — US US MFM OB COMP + 14 WK
1 series · 13 of 28 positions shown · non-contrast
Comparison: none

[Series 1: us mfm ob comp + 14 wk · 13 of 54 slices shown]
[im 2/54]
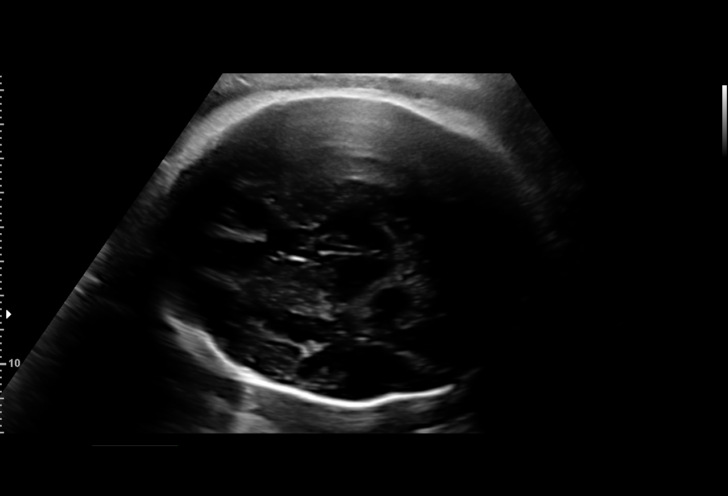
[im 6/54]
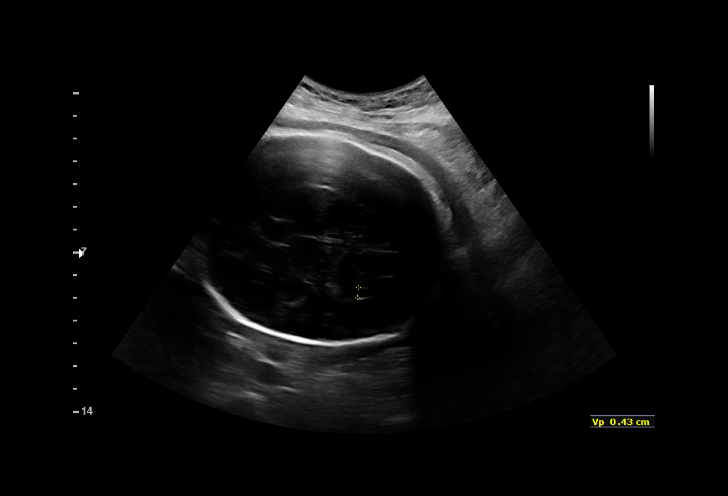
[im 10/54]
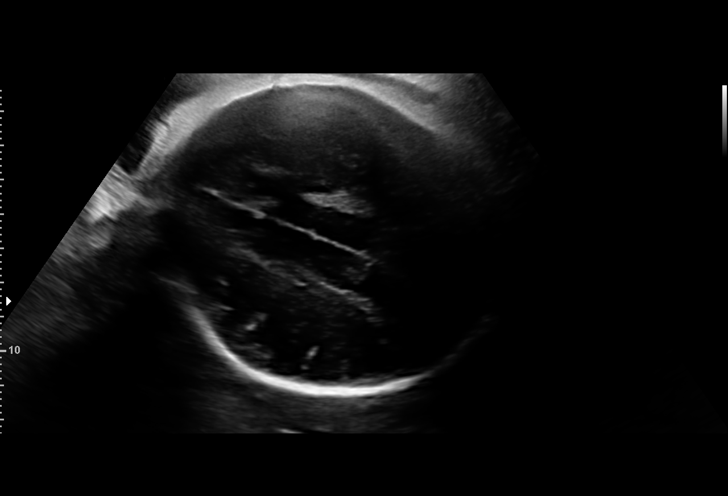
[im 14/54]
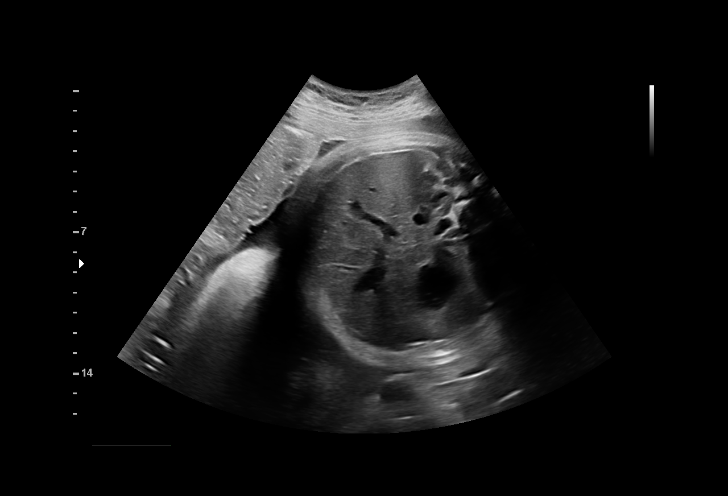
[im 18/54]
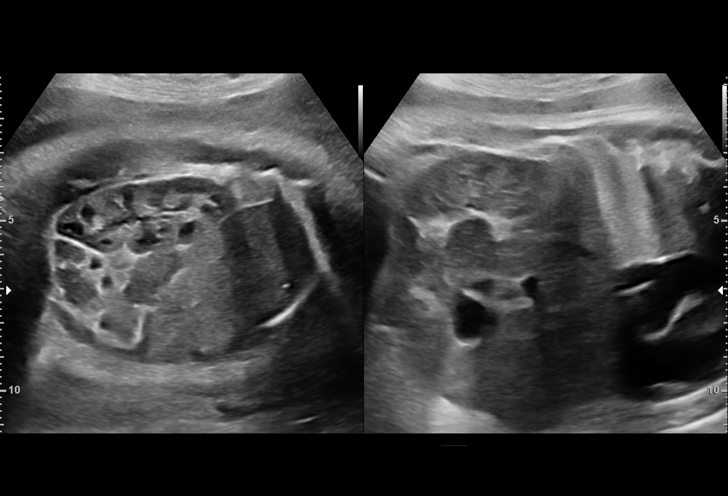
[im 22/54]
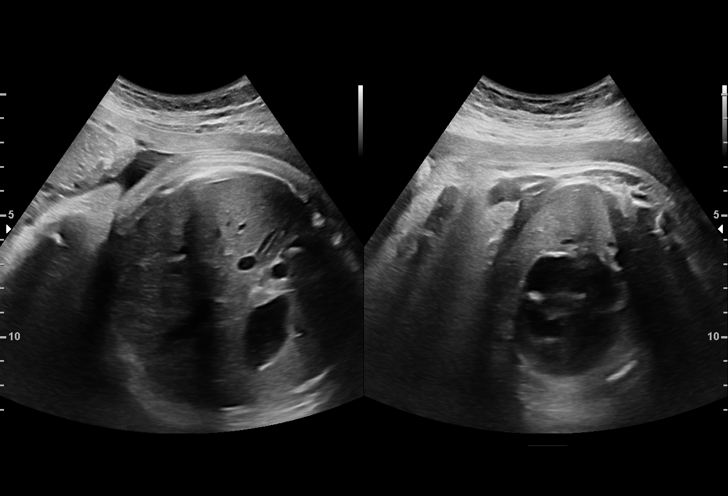
[im 28/54]
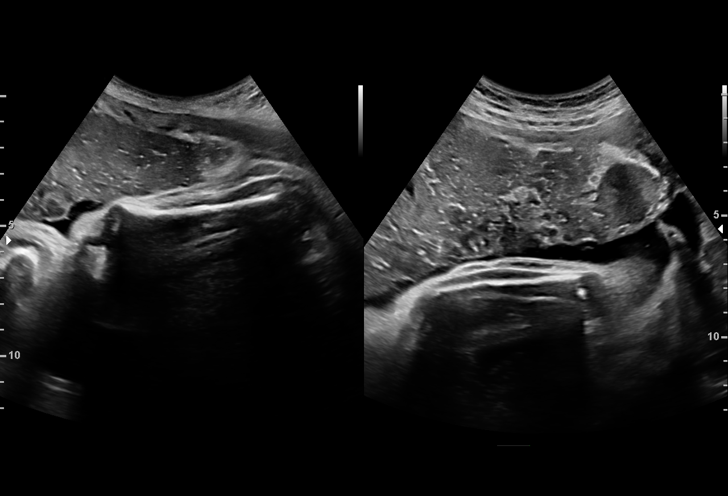
[im 32/54]
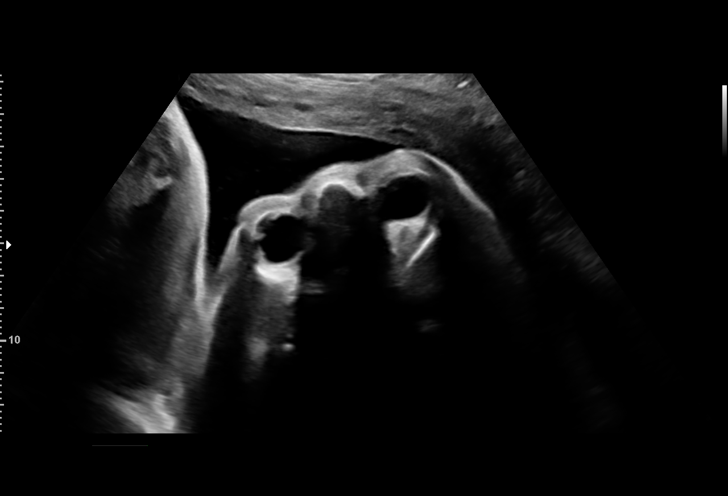
[im 36/54]
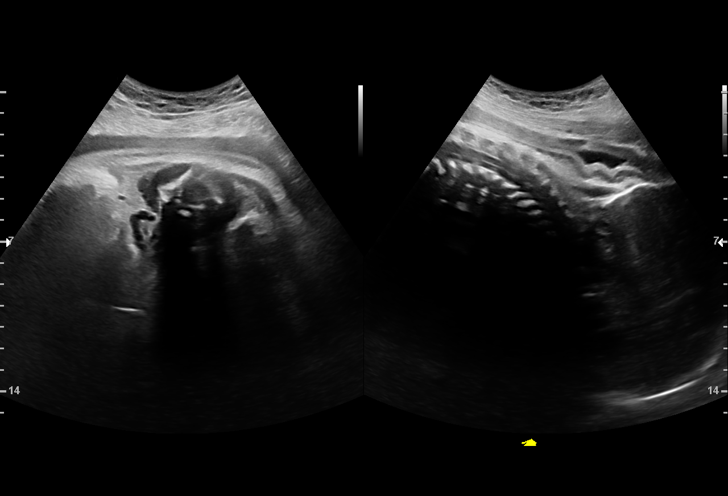
[im 40/54]
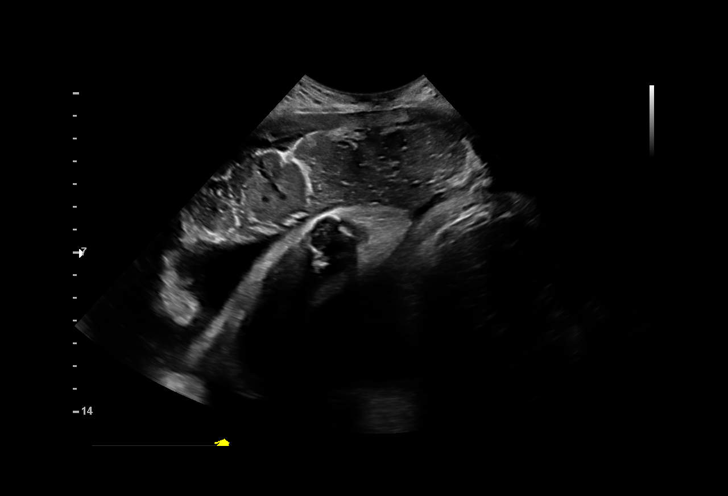
[im 44/54]
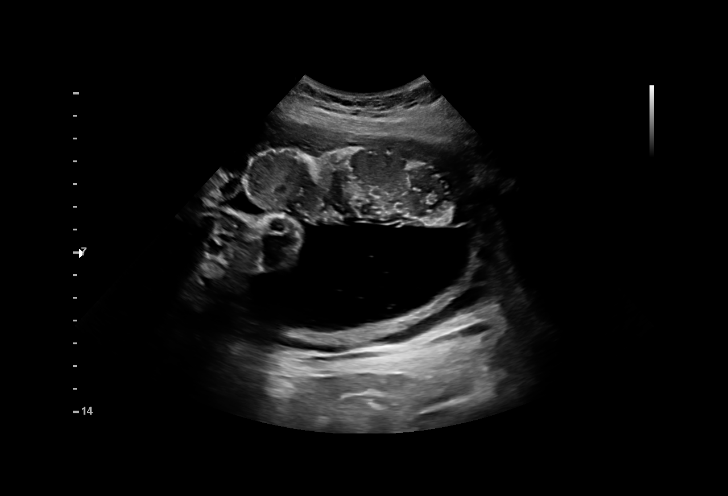
[im 48/54]
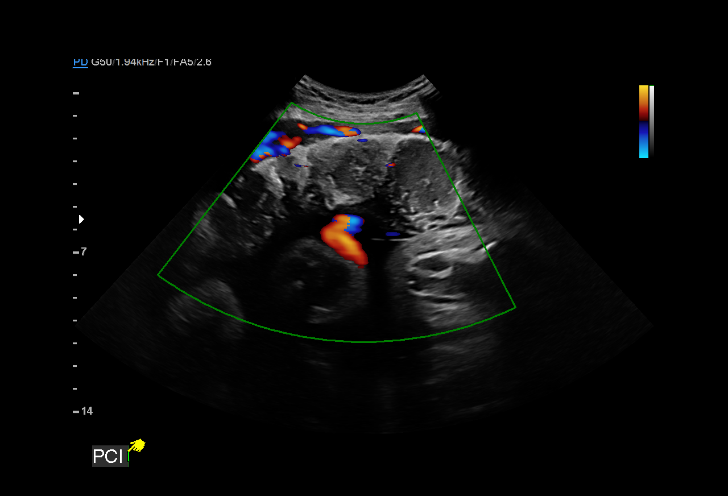
[im 52/54]
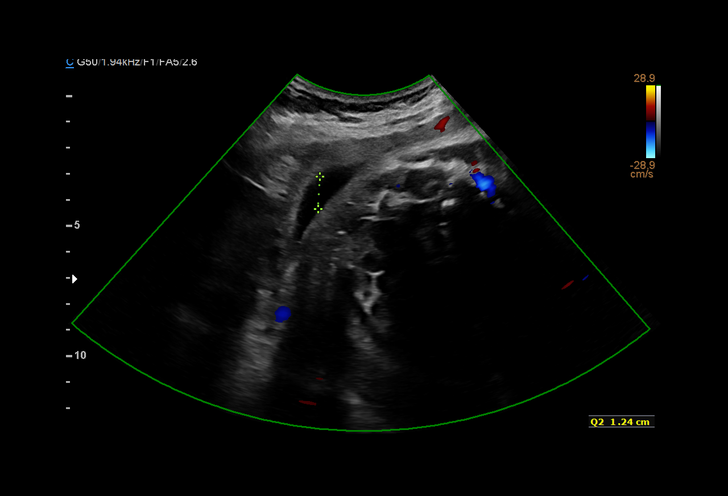

[13 of 28 positions shown; findings below may reference images not displayed]

1  US MFM OB COMP + 14 WK               76805.01     NAIRE [employer]
 ----------------------------------------------------------------------

 ----------------------------------------------------------------------
Indications

  Encounter for antenatal screening for
  malformations
  36 weeks gestation of pregnancy
  Poor obstetric history: Prior fetal
  macrosomia, antepartum
  History of cesarean delivery, currently
  pregnant (x2)
  Poor obstetric history: Previous gestational
  diabetes
 ----------------------------------------------------------------------
Vital Signs

 BMI:
Fetal Evaluation

 Num Of Fetuses:          1
 Fetal Heart Rate(bpm):   149
 Cardiac Activity:        Observed
 Presentation:            Cephalic
 Placenta:                Anterior
 P. Cord Insertion:       Visualized

 Amniotic Fluid
 AFI FV:      Within normal limits

 AFI Sum(cm)     %Tile       Largest Pocket(cm)
 15.38           58

 RUQ(cm)       RLQ(cm)       LUQ(cm)        LLQ(cm)

Biometry

 BPD:      89.5  mm     G. Age:  36w 2d         46  %    CI:        77.47   %    70 - 86
                                                         FL/HC:       22.0  %    20.8 -
 HC:      321.9  mm     G. Age:  36w 3d         15  %    HC/AC:       0.87       0.92 -
 AC:       370   mm     G. Age:  40w 6d       > 97  %    FL/BPD:      79.0  %    71 - 87
 FL:       70.7  mm     G. Age:  36w 2d         33  %    FL/AC:       19.1  %    20 - 24
 HUM:      62.7  mm     G. Age:  36w 3d         59  %

 LV:        4.3  mm

 Est. FW:    9741   gm   7 lb 15 oz    > 90  %
OB History

 Gravidity:    3         Term:   2
 Living:       2
Gestational Age

 LMP:           36w 6d        Date:  08/30/18                 EDD:   06/06/19
 U/S Today:     37w 3d                                        EDD:   06/02/19
 Best:          36w 6d     Det. By:  LMP  (08/30/18)          EDD:   06/06/19
Anatomy

 Cranium:               Appears normal         LVOT:                   Not well visualized
 Cavum:                 Appears normal         Aortic Arch:            Not well visualized
 Ventricles:            Appears normal         Ductal Arch:            Not well visualized
 Choroid Plexus:        Appears normal         Diaphragm:              Appears normal
 Cerebellum:            Appears normal         Stomach:                Appears normal, left
                                                                       sided
 Posterior Fossa:       Appears normal         Abdomen:                Appears normal
 Nuchal Fold:           Not applicable (>20    Abdominal Wall:         Not well visualized
                        wks GA)
 Face:                  Orbits appear          Cord Vessels:           Not well visualized
                        normal; profile nwv
 Lips:                  Appears normal         Kidneys:                Appear normal
 Palate:                Not well visualized    Bladder:                Appears normal
 Thoracic:              Appears normal         Spine:                  Not well visualized
 Heart:                 Appears normal         Upper Extremities:      RUE vis; LUE nwv
                        (4CH, axis, and
                        situs)
 RVOT:                  Not well visualized    Lower Extremities:      RLE vis; LLE nwv

 Other:  Nasal bone visualized. Technically difficult due to advanced GA and
         fetal position.
Cervix Uterus Adnexa

 Cervix
 Not visualized (advanced GA >42wks)
Impression

 Normal interval growth.  No ultrasonic evidence of structural
 fetal anomalies.
 Suboptimal views of the fetal anatomy was obtained
 secondary to fetal position and late gestation.
 Prior history of GDM
Recommendations

 Repeat anatomy in 4 weeks if undelivered.

## 2020-10-19 DIAGNOSIS — Z20822 Contact with and (suspected) exposure to covid-19: Secondary | ICD-10-CM | POA: Diagnosis not present

## 2025-01-21 ENCOUNTER — Ambulatory Visit
